# Patient Record
Sex: Female | Born: 2000
Health system: Southern US, Community
[De-identification: ages and names within clinical notes are randomized; demographics above are authoritative.]

## PROBLEM LIST (undated history)

## (undated) DIAGNOSIS — J302 Other seasonal allergic rhinitis: Secondary | ICD-10-CM

## (undated) DIAGNOSIS — J45909 Unspecified asthma, uncomplicated: Secondary | ICD-10-CM

## (undated) HISTORY — DX: Unspecified asthma, uncomplicated: J45.909

## (undated) HISTORY — PX: TYMPANOSTOMY TUBE PLACEMENT: SHX32

---
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
## 2022-03-09 ENCOUNTER — Other Ambulatory Visit (HOSPITAL_COMMUNITY): Payer: Self-pay

## 2022-06-25 ENCOUNTER — Ambulatory Visit: Payer: Self-pay | Admitting: Radiology

## 2022-06-29 ENCOUNTER — Encounter: Payer: Self-pay | Admitting: Radiology

## 2022-06-29 ENCOUNTER — Ambulatory Visit (INDEPENDENT_AMBULATORY_CARE_PROVIDER_SITE_OTHER): Payer: 59 | Admitting: Radiology

## 2022-06-29 VITALS — BP 102/68 | Ht 64.5 in | Wt 125.0 lb

## 2022-06-29 DIAGNOSIS — Z3009 Encounter for other general counseling and advice on contraception: Secondary | ICD-10-CM

## 2022-06-29 DIAGNOSIS — Z01419 Encounter for gynecological examination (general) (routine) without abnormal findings: Secondary | ICD-10-CM | POA: Diagnosis not present

## 2022-06-29 NOTE — Progress Notes (Signed)
   Kayla Rice 16-Nov-2000 [id number]   History:  22 y.o. G 0 presents for annual exam. No gyn concerns. No new partners. Stopped OCPs 3 months ago as cycles were better and her sex drive was lower on OCPs, unsure if she wants to restart hormones, using condoms.  Gynecologic History Patient's last menstrual period was 06/06/2022 (exact date). Period Pattern: (!) Irregular (irregular prior to taking ocps) Menstrual Flow: Moderate, Heavy Menstrual Control: Tampon, Maxi pad Dysmenorrhea: (!) Moderate Dysmenorrhea Symptoms: Cramping Contraception/Family planning: condoms Sexually active: yes, 1 female partner, using condoms, monogramous  Health Maintenance Last Pap: n/a. Results were:  Last mammogram: n/a. Results were:  Last colonoscopy: n/a. Results were:  Last Dexa: n/a. Results were:   Past medical history, past surgical history, family history and social history were all reviewed and documented in the EPIC chart.  ROS:  A ROS was performed and pertinent positives and negatives are included.  Exam:  Vitals:   06/29/22 0948  BP: 102/68  Weight: 125 lb (56.7 kg)  Height: 5' 4.5" (1.638 m)   Body mass index is 21.12 kg/m.  General appearance:  Normal Thyroid:  Symmetrical, normal in size, without palpable masses or nodularity. Respiratory  Auscultation:  Clear without wheezing or rhonchi Cardiovascular  Auscultation:  Regular rate, without rubs, murmurs or gallops  Edema/varicosities:  Not grossly evident Abdominal  Soft,nontender, without masses, guarding or rebound.  Liver/spleen:  No organomegaly noted  Hernia:  None appreciated  Skin  Inspection:  Grossly normal Breasts: Examined lying and sitting.   Right: Without masses, retractions, nipple discharge or axillary adenopathy.   Left: Without masses, retractions, nipple discharge or axillary adenopathy. 1.5cm nevi upper outer quadrant (follow by derm) Genitourinary   Deferred, declines STI screen  Patient  informed chaperone available to be present for breast and pelvic exam. Patient has requested no chaperone to be present. Patient has been advised what will be completed during breast and pelvic exam.   Assessment/Plan:   1. Well woman exam with routine gynecological exam Pap at 23 Declines STI screening  2. General counseling and advice for contraceptive management Discussed other options including nuva ring (may help with low sex drive as it does not effect SHBG) IUD or Nexplanon, Will consider and let us know if she would like to start something.    Discussed SBE regularly, pap screening as directed/appropriate. Recommend 130mins of exercise weekly, including weight bearing exercise. Encouraged the use of seatbelts, condoms and sunscreen. Return in 1 year for annual or as needed.   Kerry Dory WHNP-BC 10:23 AM 06/29/2022

## 2022-08-18 ENCOUNTER — Ambulatory Visit: Payer: 59 | Admitting: Dermatology

## 2022-08-18 VITALS — BP 133/77

## 2022-08-18 DIAGNOSIS — Q825 Congenital non-neoplastic nevus: Secondary | ICD-10-CM

## 2022-08-18 DIAGNOSIS — Z1283 Encounter for screening for malignant neoplasm of skin: Secondary | ICD-10-CM | POA: Diagnosis not present

## 2022-08-18 DIAGNOSIS — L578 Other skin changes due to chronic exposure to nonionizing radiation: Secondary | ICD-10-CM

## 2022-08-18 DIAGNOSIS — D229 Melanocytic nevi, unspecified: Secondary | ICD-10-CM

## 2022-08-18 NOTE — Patient Instructions (Addendum)
     Melanoma ABCDEs  Melanoma is the most dangerous type of skin cancer, and is the leading cause of death from skin disease.  You are more likely to develop melanoma if you: Have light-colored skin, light-colored eyes, or red or blond hair Spend a lot of time in the sun Tan regularly, either outdoors or in a tanning bed Have had blistering sunburns, especially during childhood Have a close family member who has had a melanoma Have atypical moles or large birthmarks  Early detection of melanoma is key since treatment is typically straightforward and cure rates are extremely high if we catch it early.   The first sign of melanoma is often a change in a mole or a new dark spot.  The ABCDE system is a way of remembering the signs of melanoma.  A for asymmetry:  The two halves do not match. B for border:  The edges of the growth are irregular. C for color:  A mixture of colors are present instead of an even brown color. D for diameter:  Melanomas are usually (but not always) greater than 6mm - the size of a pencil eraser. E for evolution:  The spot keeps changing in size, shape, and color.  Please check your skin once per month between visits. You can use a small mirror in front and a large mirror behind you to keep an eye on the back side or your body.   If you see any new or changing lesions before your next follow-up, please call to schedule a visit.  Please continue daily skin protection including broad spectrum sunscreen SPF 30+ to sun-exposed areas, reapplying every 2 hours as needed when you're outdoors.   Staying in the shade or wearing long sleeves, sun glasses (UVA+UVB protection) and wide brim hats (4-inch brim around the entire circumference of the hat) are also recommended for sun protection.    Due to recent changes in healthcare laws, you may see results of your pathology and/or laboratory studies on MyChart before the doctors have had a chance to review them. We  understand that in some cases there may be results that are confusing or concerning to you. Please understand that not all results are received at the same time and often the doctors may need to interpret multiple results in order to provide you with the best plan of care or course of treatment. Therefore, we ask that you please give us 2 business days to thoroughly review all your results before contacting the office for clarification. Should we see a critical lab result, you will be contacted sooner.   If You Need Anything After Your Visit  If you have any questions or concerns for your doctor, please call our main line at [phone] and press option 4 to reach your doctor's medical assistant. If no one answers, please leave a voicemail as directed and we will return your call as soon as possible. Messages left after 4 pm will be answered the following business day.   You may also send us a message via MyChart. We typically respond to MyChart messages within 1-2 business days.  For prescription refills, please ask your pharmacy to contact our office. Our fax number is [phone].  If you have an urgent issue when the clinic is closed that cannot wait until the next business day, you can page your doctor at the number below.    Please note that while we do our best to be available for urgent issues   outside of office hours, we are not available 24/7.   If you have an urgent issue and are unable to reach us, you may choose to seek medical care at your doctor's office, retail clinic, urgent care center, or emergency room.  If you have a medical emergency, please immediately call 911 or go to the emergency department.  Pager Numbers  - Dr. Kowalski: [phone]  - Dr. Moye: [phone]  - Dr. Stewart: [phone]  In the event of inclement weather, please call our main line at [phone] for an update on the status of any delays or closures.  Dermatology Medication Tips: Please  keep the boxes that topical medications come in in order to help keep track of the instructions about where and how to use these. Pharmacies typically print the medication instructions only on the boxes and not directly on the medication tubes.   If your medication is too expensive, please contact our office at [phone] option 4 or send us a message through MyChart.   We are unable to tell what your co-pay for medications will be in advance as this is different depending on your insurance coverage. However, we may be able to find a substitute medication at lower cost or fill out paperwork to get insurance to cover a needed medication.   If a prior authorization is required to get your medication covered by your insurance company, please allow us 1-2 business days to complete this process.  Drug prices often vary depending on where the prescription is filled and some pharmacies may offer cheaper prices.  The website www.goodrx.com contains coupons for medications through different pharmacies. The prices here do not account for what the cost may be with help from insurance (it may be cheaper with your insurance), but the website can give you the price if you did not use any insurance.  - You can print the associated coupon and take it with your prescription to the pharmacy.  - You may also stop by our office during regular business hours and pick up a GoodRx coupon card.  - If you need your prescription sent electronically to a different pharmacy, notify our office through [facility] MyChart or by phone at [phone] option 4.     Si Usted Necesita Algo Despus de Su Visita  Tambin puede enviarnos un mensaje a travs de MyChart. Por lo general respondemos a los mensajes de MyChart en el transcurso de 1 a 2 das hbiles.  Para renovar recetas, por favor pida a su farmacia que se ponga en contacto con nuestra oficina. Nuestro nmero de fax es el [phone].  Si tiene un asunto urgente  cuando la clnica est cerrada y que no puede esperar hasta el siguiente da hbil, puede llamar/localizar a su doctor(a) al nmero que aparece a continuacin.   Por favor, tenga en cuenta que aunque hacemos todo lo posible para estar disponibles para asuntos urgentes fuera del horario de oficina, no estamos disponibles las 24 horas del da, los 7 das de la semana.   Si tiene un problema urgente y no puede comunicarse con nosotros, puede optar por buscar atencin mdica  en el consultorio de su doctor(a), en una clnica privada, en un centro de atencin urgente o en una sala de emergencias.  Si tiene una emergencia mdica, por favor llame inmediatamente al 911 o vaya a la sala de emergencias.  Nmeros de bper  - Dr. Kowalski: [phone]  - Dra. Moye: [phone]  - Dra. Stewart: [phone]  En caso   de inclemencias del tiempo, por favor llame a nuestra lnea principal al [phone] para una actualizacin sobre el estado de cualquier retraso o cierre.  Consejos para la medicacin en dermatologa: Por favor, guarde las cajas en las que vienen los medicamentos de uso tpico para ayudarle a seguir las instrucciones sobre dnde y cmo usarlos. Las farmacias generalmente imprimen las instrucciones del medicamento slo en las cajas y no directamente en los tubos del medicamento.   Si su medicamento es muy caro, por favor, pngase en contacto con nuestra oficina llamando al [phone] y presione la opcin 4 o envenos un mensaje a travs de MyChart.   No podemos decirle cul ser su copago por los medicamentos por adelantado ya que esto es diferente dependiendo de la cobertura de su seguro. Sin embargo, es posible que podamos encontrar un medicamento sustituto a menor costo o llenar un formulario para que el seguro cubra el medicamento que se considera necesario.   Si se requiere una autorizacin previa para que su compaa de seguros cubra su medicamento, por favor permtanos de 1 a 2  das hbiles para completar este proceso.  Los precios de los medicamentos varan con frecuencia dependiendo del lugar de dnde se surte la receta y alguna farmacias pueden ofrecer precios ms baratos.  El sitio web www.goodrx.com tiene cupones para medicamentos de diferentes farmacias. Los precios aqu no tienen en cuenta lo que podra costar con la ayuda del seguro (puede ser ms barato con su seguro), pero el sitio web puede darle el precio si no utiliz ningn seguro.  - Puede imprimir el cupn correspondiente y llevarlo con su receta a la farmacia.  - Tambin puede pasar por nuestra oficina durante el horario de atencin regular y recoger una tarjeta de cupones de GoodRx.  - Si necesita que su receta se enve electrnicamente a una farmacia diferente, informe a nuestra oficina a travs de MyChart de [facility] o por telfono llamando al [phone] y presione la opcin 4.  

## 2022-08-18 NOTE — Progress Notes (Unsigned)
   Follow-Up Visit   Subjective  Kayla Rice is a 22 y.o. female who presents for the following: Skin Cancer Screening and Full Body Skin Exam, no hx of skin cancer, no fhx of skin cancer, concerned about few moles on back and L breast The patient presents for Total-Body Skin Exam (TBSE) for skin cancer screening and mole check. The patient has spots, moles and lesions to be evaluated, some may be new or changing and the patient has concerns that these could be cancer.  The following portions of the chart were reviewed this encounter and updated as appropriate: medications, allergies, medical history  Review of Systems:  No other skin or systemic complaints except as noted in HPI or Assessment and Plan.  Objective  Well appearing patient in no apparent distress; mood and affect are within normal limits.  A full examination was performed including scalp, head, eyes, ears, nose, lips, neck, chest, axillae, abdomen, back, buttocks, bilateral upper extremities, bilateral lower extremities, hands, feet, fingers, toes, fingernails, and toenails. All findings within normal limits unless otherwise noted below.   Relevant physical exam findings are noted in the Assessment and Plan.   Assessment & Plan   MELANOCYTIC NEVI - Tan-brown and/or pink-flesh-colored symmetric macules and papules - Benign appearing on exam today - Observation - Call clinic for new or changing moles - Recommend daily use of broad spectrum spf 30+ sunscreen to sun-exposed areas.   ACTINIC DAMAGE - Chronic condition, secondary to cumulative UV/sun exposure - diffuse scaly erythematous macules with underlying dyspigmentation - Recommend daily broad spectrum sunscreen SPF 30+ to sun-exposed areas, reapply every 2 hours as needed.  - Staying in the shade or wearing long sleeves, sun glasses (UVA+UVB protection) and wide brim hats (4-inch brim around the entire circumference of the hat) are also recommended for sun  protection.  - Call for new or changing lesions.  SKIN CANCER SCREENING PERFORMED TODAY.  CONGENITAL NEVUS Exam: 1.2cm flat reg brown pap, L upper outer quadrant breast Treatment Plan: Benign-appearing.  Observation.  Call clinic for new or changing lesions.  Recommend daily use of broad spectrum spf 30+ sunscreen to sun-exposed areas.      Return if symptoms worsen or fail to improve.  I, Othelia Pulling, RMA, am acting as scribe for Sarina Ser, MD .  Documentation: I have reviewed the above documentation for accuracy and completeness, and I agree with the above.  Sarina Ser, MD

## 2022-08-19 ENCOUNTER — Encounter: Payer: Self-pay | Admitting: Dermatology

## 2022-09-02 ENCOUNTER — Ambulatory Visit: Payer: 59 | Admitting: Family Medicine

## 2022-09-08 ENCOUNTER — Other Ambulatory Visit: Payer: Self-pay

## 2022-09-08 ENCOUNTER — Encounter: Payer: Self-pay | Admitting: Dermatology

## 2022-09-08 ENCOUNTER — Other Ambulatory Visit (HOSPITAL_COMMUNITY): Payer: Self-pay

## 2022-09-08 ENCOUNTER — Ambulatory Visit: Payer: 59 | Admitting: Dermatology

## 2022-09-08 VITALS — BP 114/74 | HR 79

## 2022-09-08 DIAGNOSIS — Q825 Congenital non-neoplastic nevus: Secondary | ICD-10-CM

## 2022-09-08 DIAGNOSIS — L700 Acne vulgaris: Secondary | ICD-10-CM

## 2022-09-08 MED ORDER — SPIRONOLACTONE 50 MG PO TABS
50.0000 mg | ORAL_TABLET | Freq: Two times a day (BID) | ORAL | 2 refills | Status: DC
  Filled 2022-09-08: qty 60, 30d supply, fill #0

## 2022-09-08 MED ORDER — ADAPALENE 0.3 % EX GEL
Freq: Every day | CUTANEOUS | 3 refills | Status: DC
  Filled 2022-09-08: qty 45, 30d supply, fill #0
  Filled 2023-04-21 – 2023-08-28 (×2): qty 45, 30d supply, fill #1

## 2022-09-08 MED ORDER — CLINDAMYCIN PHOS-BENZOYL PEROX 1.2-5 % EX GEL
Freq: Every morning | CUTANEOUS | 3 refills | Status: DC
  Filled 2022-09-08: qty 45, 30d supply, fill #0
  Filled 2023-04-21: qty 45, 30d supply, fill #1

## 2022-09-08 NOTE — Patient Instructions (Addendum)
Discussed resulting small scar with shave removal, and possible recurrence of lesion.  Recommend vaseline ointment to area daily and cover until healed.  Recommend photoprotection/sunscreen to area to prevent discoloration of scar.  Once healed, may apply OTC Serica scar gel bid to thickened scars.   Start Spironolactone 50 MG - take 1 tablet by mouth daily. If no side effects after 2 weeks, may increase to 1 tablet twice daily.  Spironolactone can cause increased urination and cause blood pressure to decrease. Please watch for signs of lightheadedness and be cautious when changing position. It can sometimes cause breast tenderness or an irregular period in premenopausal women. It can also increase potassium. The increase in potassium usually is not a concern unless you are taking other medicines that also increase potassium, so please be sure your doctor knows all of the other medications you are taking. This medication should not be taken by pregnant women.  This medicine should also not be taken together with sulfa drugs like Bactrim (trimethoprim/sulfamethexazole).   Start Duac (clindaymcin-benzoyl peroxide) Gel - apply to face every morning. Risk bleaching. Benzoyl peroxide can cause dryness and irritation of the skin. It can also bleach fabric. When used together with Aczone (dapsone) cream, it can stain the skin orange.  Start Adapalene 0.3% gel Apply a pea-sized amount to face every night as tolerated. May apply moisturizer first.  Topical retinoid medications like tretinoin/Retin-A, adapalene/Differin, tazarotene/Fabior, and Epiduo/Epiduo Forte can cause dryness and irritation when first started. Only apply a pea-sized amount to the entire affected area. Avoid applying it around the eyes, edges of mouth and creases at the nose. If you experience irritation, use a good moisturizer first and/or apply the medicine less often. If you are doing well with the medicine, you can increase how often you use  it until you are applying every night. Be careful with sun protection while using this medication as it can make you sensitive to the sun. This medicine should not be used by pregnant women.

## 2022-09-08 NOTE — Progress Notes (Signed)
Follow-Up Visit   Subjective  Kayla Rice is a 22 y.o. female who presents for the following: Acne Vulgaris of the face. She has used adapalene gel in the past and sulfa wash. She is not currently treating acne. She uses CeraVe Cleanser. She was improved while on birth control pills in the past. She tends to get cystic acne.  She has sensitive skin.  She also has a mole on her left breast that she would like discuss having removed due to irritation from bra. Mole present for years.    The following portions of the chart were reviewed this encounter and updated as appropriate: medications, allergies, medical history  Review of Systems:  No other skin or systemic complaints except as noted in HPI or Assessment and Plan.  Objective  Well appearing patient in no apparent distress; mood and affect are within normal limits.  Areas Examined: Face, chest and back  Relevant exam findings are noted in the Assessment and Plan. Left Lateral Breast 1.2 cm brown plaque, regular pigment       Assessment & Plan   Congenital non-neoplastic nevus Left Lateral Breast  Benign-appearing.  Recommend observation due to size and location, but due to irritation by bra, patient would like removed. Discussed surgical excision vs shave removal. Discussed resulting round discolored scar with shave removal, and possible recurrence of lesion.  Recommend vaseline ointment to area daily and cover until healed.  Recommend photoprotection/sunscreen to area to prevent discoloration of scar.  Once healed, may apply OTC Serica scar gel bid to thickened scars.  Discussed larger linear scar with excision- may stretch and widen, complete removal of mole  Pt wants to schedule for shave removal   ACNE VULGARIS Exam: Closed comedones on the forehead; violaceous macules on the right cheek and jaw; cystic papule on right chin, left chin.  Chronic and persistent condition with duration or expected duration over  one year. Condition is bothersome/symptomatic for patient. Currently flared.   Treatment Plan: BP 114/74 Start Spironolactone 50 MG take 1 tablet by mouth once daily. If no side effects after 2 weeks, may increase to 2 pills daily. Rx written 1 po BID dsp #60 2Rf. Spironolactone can cause increased urination and cause blood pressure to decrease. Please watch for signs of lightheadedness and be cautious when changing position. It can sometimes cause breast tenderness or an irregular period in premenopausal women. It can also increase potassium. The increase in potassium usually is not a concern unless you are taking other medicines that also increase potassium, so please be sure your doctor knows all of the other medications you are taking. This medication should not be taken by pregnant women.  This medicine should also not be taken together with sulfa drugs like Bactrim (trimethoprim/sulfamethexazole).   Start Duac Gel Apply to face QAM for acne  Benzoyl peroxide can cause dryness and irritation of the skin. It can also bleach fabric. When used together with Aczone (dapsone) cream, it can stain the skin orange.  Start Adapalene 0.3% Gel Apply a pea-sized amount to face QHS as tolerated dsp 45g  Topical retinoid medications like tretinoin/Retin-A, adapalene/Differin, tazarotene/Fabior, and Epiduo/Epiduo Forte can cause dryness and irritation when first started. Only apply a pea-sized amount to the entire affected area. Avoid applying it around the eyes, edges of mouth and creases at the nose. If you experience irritation, use a good moisturizer first and/or apply the medicine less often. If you are doing well with the medicine, you can increase how  often you use it until you are applying every night. Be careful with sun protection while using this medication as it can make you sensitive to the sun. This medicine should not be used by pregnant women.     Return in about 10 weeks (around 11/17/2022) for  Acne. Sooner for shave removal congenital nevus L breast.  I, Cherlyn Labella, CMA, am acting as scribe for Willeen Niece, MD .   Documentation: I have reviewed the above documentation for accuracy and completeness, and I agree with the above.  Willeen Niece, MD

## 2022-09-22 ENCOUNTER — Ambulatory Visit (INDEPENDENT_AMBULATORY_CARE_PROVIDER_SITE_OTHER): Payer: 59 | Admitting: Family Medicine

## 2022-09-22 ENCOUNTER — Encounter: Payer: Self-pay | Admitting: Family Medicine

## 2022-09-22 VITALS — BP 90/60 | HR 96 | Temp 99.1°F | Ht 64.0 in | Wt 126.0 lb

## 2022-09-22 DIAGNOSIS — R739 Hyperglycemia, unspecified: Secondary | ICD-10-CM | POA: Diagnosis not present

## 2022-09-22 DIAGNOSIS — Z0001 Encounter for general adult medical examination with abnormal findings: Secondary | ICD-10-CM | POA: Diagnosis not present

## 2022-09-22 DIAGNOSIS — Z114 Encounter for screening for human immunodeficiency virus [HIV]: Secondary | ICD-10-CM | POA: Diagnosis not present

## 2022-09-22 DIAGNOSIS — R7301 Impaired fasting glucose: Secondary | ICD-10-CM | POA: Diagnosis not present

## 2022-09-22 DIAGNOSIS — Z0000 Encounter for general adult medical examination without abnormal findings: Secondary | ICD-10-CM | POA: Diagnosis not present

## 2022-09-22 DIAGNOSIS — Z1159 Encounter for screening for other viral diseases: Secondary | ICD-10-CM | POA: Diagnosis not present

## 2022-09-22 LAB — CBC WITH DIFFERENTIAL/PLATELET
Absolute Monocytes: 636 cells/uL (ref 200–950)
MPV: 10.8 fL (ref 7.5–12.5)
Monocytes Relative: 12 %
Neutrophils Relative %: 62.3 %
RDW: 12 % (ref 11.0–15.0)
WBC: 5.3 10*3/uL (ref 3.8–10.8)

## 2022-09-22 NOTE — Addendum Note (Signed)
Addended by: Park Meo on: 09/22/2022 02:11 PM   Modules accepted: Orders

## 2022-09-22 NOTE — Patient Instructions (Signed)
It was great to meet you today and I'm excited to have you join the [facility] practice. I hope you had a positive experience today! If you feel so inclined, please feel free to recommend our practice to friends and family. Alynna Hargrove, FNP-C  

## 2022-09-22 NOTE — Progress Notes (Signed)
New Patient Office Visit  Subjective    Patient ID: Kayla Rice, female    DOB: 2000-10-29  Age: 22 y.o. MRN: [id number]  CC:  Chief Complaint  Patient presents with   Establish Care    HPI Arie Spanier presents to establish care. Oriented to practice routines and expectations. PMH includes acne managed by dermatology and well controlled on current regimen. 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      Outpatient Encounter Medications as of 09/22/2022  Medication Sig   Adapalene (DIFFERIN) 0.3 % gel Apply a pea sized amount to face every night as tolerated   cetirizine (ZYRTEC) 10 MG tablet Take 1 tablet (10 mg total) by mouth daily.   Clindamycin-Benzoyl Per, Refr, gel Apply to face every morning for acne.   spironolactone (ALDACTONE) 50 MG tablet Take 1 tablet (50 mg total) by mouth 2 (two) times daily.   olopatadine (PATANOL) 0.1 % ophthalmic solution Place 1 drop into both eyes 2 (two) times daily as needed for allergies. (Patient not taking: Reported on 09/22/2022)   No facility-administered encounter medications on file as of 09/22/2022.    Past Medical History:  Diagnosis Date   Asthma    as a child when sick   Seasonal allergies     Past Surgical History:  Procedure Laterality Date   TYMPANOSTOMY TUBE PLACEMENT      Family History  Problem Relation Age of Onset   Diabetes Maternal Grandmother    Hypertension Maternal Grandmother    Thyroid disease Maternal Grandmother    Heart attack Maternal Grandmother    Prostate cancer Maternal Grandfather    Breast cancer Other        great paternal grandmother   Healthy Mother    Healthy Father     Social History   Socioeconomic History   Marital status: Single    Spouse name: Not on file   Number of children: Not on file   Years of education: Not on file   Highest education level: Not on file  Occupational History   Not on file  Tobacco Use   Smoking status: Never    Passive exposure:  Never   Smokeless tobacco: Never  Vaping Use   Vaping Use: Never used  Substance and Sexual Activity   Alcohol use: Never   Drug use: Never   Sexual activity: Yes    Partners: Male    Birth control/protection: Condom    Comment: menarche 22yo, sexual debut 22yo  Other Topics Concern   Not on file  Social History Narrative   Not on file   Social Determinants of Health   Financial Resource Strain: Not on file  Food Insecurity: Not on file  Transportation Needs: Not on file  Physical Activity: Not on file  Stress: Not on file  Social Connections: Not on file  Intimate Partner Violence: Not on file    Review of Systems  Constitutional: Negative.   HENT: Negative.    Eyes: Negative.   Respiratory: Negative.    Cardiovascular: Negative.   Gastrointestinal: Negative.   Genitourinary: Negative.   Musculoskeletal: Negative.   Skin: Negative.   Neurological: Negative.   Endo/Heme/Allergies: Negative.   Psychiatric/Behavioral: Negative.    All other systems reviewed and are negative.       Objective    BP 90/60 Comment: pt was a little the weather the last few days  Pulse 96   Temp 99.1 F (37.3 C) (Oral)   Ht 5\' 4"  (  1.626 m)   Wt 126 lb (57.2 kg)   LMP 08/12/2021 Comment: just come off birth control  SpO2 100%   BMI 21.63 kg/m   Physical Exam Vitals and nursing note reviewed.  Constitutional:      Appearance: Normal appearance. She is normal weight.  HENT:     Head: Normocephalic and atraumatic.     Right Ear: Tympanic membrane, ear canal and external ear normal.     Left Ear: Tympanic membrane, ear canal and external ear normal.     Nose: Nose normal.     Mouth/Throat:     Mouth: Mucous membranes are moist.     Pharynx: Oropharynx is clear.  Eyes:     Extraocular Movements: Extraocular movements intact.     Conjunctiva/sclera: Conjunctivae normal.     Pupils: Pupils are equal, round, and reactive to light.  Cardiovascular:     Rate and Rhythm: Normal  rate and regular rhythm.     Pulses: Normal pulses.     Heart sounds: Normal heart sounds.  Pulmonary:     Effort: Pulmonary effort is normal.     Breath sounds: Normal breath sounds.  Abdominal:     General: Bowel sounds are normal.     Palpations: Abdomen is soft.  Musculoskeletal:        General: Normal range of motion.     Cervical back: Normal range of motion and neck supple.  Skin:    General: Skin is warm and dry.     Capillary Refill: Capillary refill takes less than 2 seconds.  Neurological:     General: No focal deficit present.     Mental Status: She is alert and oriented to person, place, and time. Mental status is at baseline.  Psychiatric:        Mood and Affect: Mood normal.        Behavior: Behavior normal.        Thought Content: Thought content normal.        Judgment: Judgment normal.         Assessment & Plan:   Problem List Items Addressed This Visit     Physical exam, annual - Primary    Today your medical history was reviewed and routine physical exam with labs was performed. In addition we completed required labs and form for [facility].  Recommend 150 minutes of moderate intensity exercise weekly and consuming a well-balanced diet. Advised to stop smoking if a smoker, avoid smoking if a non-smoker, limit alcohol consumption to 1 drink per day for women and 2 drinks per day for men, and avoid illicit drug use. Counseled on safe sex practices and offered STI testing today. Counseled on the importance of sunscreen use. Counseled in mental health awareness and when to seek medical care. Vaccine maintenance discussed. Appropriate health maintenance items reviewed. Return to office in 1 year for annual physical exam.       Relevant Orders   CBC with Differential/Platelet   COMPLETE METABOLIC PANEL WITH GFR   QuantiFERON-TB Gold Plus   LDL Cholesterol, Direct   HBsAb Quant HBIG Assessment   Varicella zoster antibody, IgG   Measles/Mumps/Rubella Immunity    Other Visit Diagnoses     Screening for HIV (human immunodeficiency virus)       Relevant Orders   HIV Antibody (routine testing w rflx)   Need for hepatitis C screening test       Relevant Orders   Hepatitis C antibody  Return in about 1 year (around 09/22/2023) for annual physical.   Park Meo, FNP

## 2022-09-22 NOTE — Assessment & Plan Note (Signed)
Today your medical history was reviewed and routine physical exam with labs was performed. In addition we completed required labs and form for [facility].  Recommend 150 minutes of moderate intensity exercise weekly and consuming a well-balanced diet. Advised to stop smoking if a smoker, avoid smoking if a non-smoker, limit alcohol consumption to 1 drink per day for women and 2 drinks per day for men, and avoid illicit drug use. Counseled on safe sex practices and offered STI testing today. Counseled on the importance of sunscreen use. Counseled in mental health awareness and when to seek medical care. Vaccine maintenance discussed. Appropriate health maintenance items reviewed. Return to office in 1 year for annual physical exam.

## 2022-09-23 LAB — COMPLETE METABOLIC PANEL WITH GFR
ALT: 14 U/L (ref 5–32)
AST: 16 U/L (ref 12–32)
Chloride: 105 mmol/L (ref 98–110)
Globulin: 2.9 g/dL (calc) (ref 2.0–3.8)
Total Bilirubin: 0.4 mg/dL (ref 0.2–1.1)
Total Protein: 7.2 g/dL (ref 6.3–8.2)

## 2022-09-23 LAB — MEASLES/MUMPS/RUBELLA IMMUNITY
Mumps IgG: 189 AU/mL
Rubella: 1.06 Index
Rubeola IgG: >300 AU/mL

## 2022-09-23 LAB — CBC WITH DIFFERENTIAL/PLATELET
MCHC: 34.3 g/dL (ref 32.0–36.0)
MCV: 87.5 fL (ref 80.0–100.0)
Platelets: 259 10*3/uL (ref 140–400)
RBC: 4.63 10*6/uL (ref 3.80–5.10)
Total Lymphocyte: 18.9 %

## 2022-09-23 LAB — HEPATITIS B SURFACE ANTIBODY, QUANTITATIVE: Hep B S AB Quant (Post): <5 m[IU]/mL — ABNORMAL LOW (ref >=10)

## 2022-09-23 LAB — VARICELLA ZOSTER ANTIBODY, IGG: Varicella IgG: 283.6 index

## 2022-09-23 NOTE — Addendum Note (Signed)
Addended by: Park Meo on: 09/23/2022 07:46 AM   Modules accepted: Orders

## 2022-09-24 LAB — QUANTIFERON-TB GOLD PLUS
Mitogen-NIL: >10 IU/mL
NIL: 0.08 IU/mL
QuantiFERON-TB Gold Plus: NEGATIVE
TB1-NIL: 0.01 IU/mL
TB2-NIL: <0 IU/mL

## 2022-09-24 LAB — CBC WITH DIFFERENTIAL/PLATELET
Basophils Absolute: 21 cells/uL (ref 0–200)
Basophils Relative: 0.4 %
Eosinophils Absolute: 339 cells/uL (ref 15–500)
Eosinophils Relative: 6.4 %
HCT: 40.5 % (ref 35.0–45.0)
Hemoglobin: 13.9 g/dL (ref 11.7–15.5)
Lymphs Abs: 1002 cells/uL (ref 850–3900)
MCH: 30 pg (ref 27.0–33.0)
Neutro Abs: 3302 cells/uL (ref 1500–7800)

## 2022-09-24 LAB — COMPLETE METABOLIC PANEL WITH GFR
AG Ratio: 1.5 (calc) (ref 1.0–2.5)
Albumin: 4.3 g/dL (ref 3.6–5.1)
Alkaline phosphatase (APISO): 54 U/L (ref 36–128)
BUN: 10 mg/dL (ref 7–20)
CO2: 23 mmol/L (ref 20–32)
Calcium: 9.4 mg/dL (ref 8.9–10.4)
Creat: 0.84 mg/dL (ref 0.50–0.96)
Glucose, Bld: 102 mg/dL — ABNORMAL HIGH (ref 65–99)
Potassium: 4.1 mmol/L (ref 3.8–5.1)
Sodium: 139 mmol/L (ref 135–146)
eGFR: 103 mL/min/{1.73_m2} (ref >=60)

## 2022-09-24 LAB — HIV ANTIBODY (ROUTINE TESTING W REFLEX): HIV 1&2 Ab, 4th Generation: NONREACTIVE

## 2022-09-24 LAB — HEMOGLOBIN A1C
Hgb A1c MFr Bld: 4.9 % of total Hgb (ref <5.7)
Mean Plasma Glucose: 94 mg/dL
eAG (mmol/L): 5.2 mmol/L

## 2022-09-24 LAB — LDL CHOLESTEROL, DIRECT: Direct LDL: 74 mg/dL (ref <110)

## 2022-09-24 LAB — HEPATITIS C ANTIBODY: Hepatitis C Ab: NONREACTIVE

## 2022-09-29 ENCOUNTER — Ambulatory Visit: Payer: 59 | Admitting: Dermatology

## 2022-09-29 VITALS — BP 114/80 | HR 88

## 2022-09-29 DIAGNOSIS — D225 Melanocytic nevi of trunk: Secondary | ICD-10-CM | POA: Diagnosis not present

## 2022-09-29 DIAGNOSIS — D485 Neoplasm of uncertain behavior of skin: Secondary | ICD-10-CM

## 2022-09-29 NOTE — Progress Notes (Signed)
   Follow-Up Visit   Subjective  Kayla Rice is a 22 y.o. 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 Patient here for shave removal due to irritation from bra.  Patient accompanied by mother.  The following portions of the chart were reviewed this encounter and updated as appropriate: medications, allergies, medical history  Review of Systems:  No other skin or systemic complaints except as noted in HPI or Assessment and Plan.  Objective  Well appearing patient in no apparent distress; mood and affect are within normal limits.  A focused examination was performed of the following areas: Face, left breast Relevant physical exam findings are noted in the Assessment and Plan.  Left Lateral Breast 1.4 x 1.0 cm brown plaque, regular pigment        Assessment & Plan   Neoplasm of uncertain behavior of skin Left Lateral Breast  Epidermal / dermal shaving  Lesion diameter (cm):  2 Informed consent: discussed and consent obtained   Patient was prepped and draped in usual sterile fashion: Area prepped with alcohol. Anesthesia: the lesion was anesthetized in a standard fashion   Anesthetic:  1% lidocaine w/ epinephrine 1-100,000 buffered w/ 8.4% NaHCO3 Instrument used: flexible razor blade   Hemostasis achieved with: pressure, aluminum chloride and electrodesiccation   Outcome: patient tolerated procedure well   Post-procedure details: wound care instructions given   Post-procedure details comment:  Ointment and small bandage applied Additional details:  Final 2.0 x 1.1 CM  Specimen 1 - Surgical pathology Differential Diagnosis: Congenital Nevus vs other Check Margins: No  Discussed resulting scar with shave removal, and possible recurrence of lesion.  Recommend vaseline ointment to area daily and cover until healed.  Recommend photoprotection/sunscreen to area to prevent discoloration of scar.  Once healed, may apply OTC Serica  scar gel bid to thickened scars.      Return as scheduled for acne f/up.  ICherlyn Labella, CMA, am acting as scribe for Willeen Niece, MD .   Documentation: I have reviewed the above documentation for accuracy and completeness, and I agree with the above.  Willeen Niece, MD

## 2022-09-29 NOTE — Patient Instructions (Signed)
Wound Care Instructions  Cleanse wound gently with soap and water once a day then pat dry with clean gauze. Apply a thin coat of Petrolatum (petroleum jelly, "Vaseline") over the wound (unless you have an allergy to this). We recommend that you use a new, sterile tube of Vaseline. Do not pick or remove scabs. Do not remove the yellow or white "healing tissue" from the base of the wound.  Cover the wound with fresh, clean, nonstick gauze and secure with paper tape. You may use Band-Aids in place of gauze and tape if the wound is small enough, but would recommend trimming much of the tape off as there is often too much. Sometimes Band-Aids can irritate the skin.  You should call the office for your biopsy report after 1 week if you have not already been contacted.  If you experience any problems, such as abnormal amounts of bleeding, swelling, significant bruising, significant pain, or evidence of infection, please call the office immediately.  FOR ADULT SURGERY PATIENTS: If you need something for pain relief you may take 1 extra strength Tylenol (acetaminophen) AND 2 Ibuprofen (200mg each) together every 4 hours as needed for pain. (do not take these if you are allergic to them or if you have a reason you should not take them.) Typically, you may only need pain medication for 1 to 3 days.     Due to recent changes in healthcare laws, you may see results of your pathology and/or laboratory studies on MyChart before the doctors have had a chance to review them. We understand that in some cases there may be results that are confusing or concerning to you. Please understand that not all results are received at the same time and often the doctors may need to interpret multiple results in order to provide you with the best plan of care or course of treatment. Therefore, we ask that you please give us 2 business days to thoroughly review all your results before contacting the office for clarification. Should  we see a critical lab result, you will be contacted sooner.   If You Need Anything After Your Visit  If you have any questions or concerns for your doctor, please call our main line at [phone] and press option 4 to reach your doctor's medical assistant. If no one answers, please leave a voicemail as directed and we will return your call as soon as possible. Messages left after 4 pm will be answered the following business day.   You may also send us a message via MyChart. We typically respond to MyChart messages within 1-2 business days.  For prescription refills, please ask your pharmacy to contact our office. Our fax number is [phone].  If you have an urgent issue when the clinic is closed that cannot wait until the next business day, you can page your doctor at the number below.    Please note that while we do our best to be available for urgent issues outside of office hours, we are not available 24/7.   If you have an urgent issue and are unable to reach us, you may choose to seek medical care at your doctor's office, retail clinic, urgent care center, or emergency room.  If you have a medical emergency, please immediately call 911 or go to the emergency department.  Pager Numbers  - Dr. Kowalski: [phone]  - Dr. Moye: [phone]  - Dr. Stewart: [phone]  In the event of inclement weather, please call our main line at   [phone] for an update on the status of any delays or closures.  Dermatology Medication Tips: Please keep the boxes that topical medications come in in order to help keep track of the instructions about where and how to use these. Pharmacies typically print the medication instructions only on the boxes and not directly on the medication tubes.   If your medication is too expensive, please contact our office at [phone] option 4 or send us a message through MyChart.   We are unable to tell what your co-pay for medications will be in  advance as this is different depending on your insurance coverage. However, we may be able to find a substitute medication at lower cost or fill out paperwork to get insurance to cover a needed medication.   If a prior authorization is required to get your medication covered by your insurance company, please allow us 1-2 business days to complete this process.  Drug prices often vary depending on where the prescription is filled and some pharmacies may offer cheaper prices.  The website www.goodrx.com contains coupons for medications through different pharmacies. The prices here do not account for what the cost may be with help from insurance (it may be cheaper with your insurance), but the website can give you the price if you did not use any insurance.  - You can print the associated coupon and take it with your prescription to the pharmacy.  - You may also stop by our office during regular business hours and pick up a GoodRx coupon card.  - If you need your prescription sent electronically to a different pharmacy, notify our office through [facility] MyChart or by phone at [phone] option 4.     Si Usted Necesita Algo Despus de Su Visita  Tambin puede enviarnos un mensaje a travs de MyChart. Por lo general respondemos a los mensajes de MyChart en el transcurso de 1 a 2 das hbiles.  Para renovar recetas, por favor pida a su farmacia que se ponga en contacto con nuestra oficina. Nuestro nmero de fax es el [phone].  Si tiene un asunto urgente cuando la clnica est cerrada y que no puede esperar hasta el siguiente da hbil, puede llamar/localizar a su doctor(a) al nmero que aparece a continuacin.   Por favor, tenga en cuenta que aunque hacemos todo lo posible para estar disponibles para asuntos urgentes fuera del horario de oficina, no estamos disponibles las 24 horas del da, los 7 das de la semana.   Si tiene un problema urgente y no puede comunicarse con nosotros, puede  optar por buscar atencin mdica  en el consultorio de su doctor(a), en una clnica privada, en un centro de atencin urgente o en una sala de emergencias.  Si tiene una emergencia mdica, por favor llame inmediatamente al 911 o vaya a la sala de emergencias.  Nmeros de bper  - Dr. Kowalski: [phone]  - Dra. Moye: [phone]  - Dra. Stewart: [phone]  En caso de inclemencias del tiempo, por favor llame a nuestra lnea principal al [phone] para una actualizacin sobre el estado de cualquier retraso o cierre.  Consejos para la medicacin en dermatologa: Por favor, guarde las cajas en las que vienen los medicamentos de uso tpico para ayudarle a seguir las instrucciones sobre dnde y cmo usarlos. Las farmacias generalmente imprimen las instrucciones del medicamento slo en las cajas y no directamente en los tubos del medicamento.   Si su medicamento es muy caro, por favor, pngase en contacto con   nuestra oficina llamando al [phone] y presione la opcin 4 o envenos un mensaje a travs de MyChart.   No podemos decirle cul ser su copago por los medicamentos por adelantado ya que esto es diferente dependiendo de la cobertura de su seguro. Sin embargo, es posible que podamos encontrar un medicamento sustituto a menor costo o llenar un formulario para que el seguro cubra el medicamento que se considera necesario.   Si se requiere una autorizacin previa para que su compaa de seguros cubra su medicamento, por favor permtanos de 1 a 2 das hbiles para completar este proceso.  Los precios de los medicamentos varan con frecuencia dependiendo del lugar de dnde se surte la receta y alguna farmacias pueden ofrecer precios ms baratos.  El sitio web www.goodrx.com tiene cupones para medicamentos de diferentes farmacias. Los precios aqu no tienen en cuenta lo que podra costar con la ayuda del seguro (puede ser ms barato con su seguro), pero el sitio web puede darle el  precio si no utiliz ningn seguro.  - Puede imprimir el cupn correspondiente y llevarlo con su receta a la farmacia.  - Tambin puede pasar por nuestra oficina durante el horario de atencin regular y recoger una tarjeta de cupones de GoodRx.  - Si necesita que su receta se enve electrnicamente a una farmacia diferente, informe a nuestra oficina a travs de MyChart de [facility] o por telfono llamando al [phone] y presione la opcin 4.  

## 2022-10-04 ENCOUNTER — Telehealth: Payer: Self-pay

## 2022-10-04 NOTE — Telephone Encounter (Signed)
Pt's mom called regarding pt's Hepatitis B results and if patient will need to repeat the series? Thanks.

## 2022-10-04 NOTE — Telephone Encounter (Signed)
LVM for pt's mom per pt's DPR, regarding revaccinated for pt's Heb B vaccines per Amber,FNP.

## 2022-10-04 NOTE — Telephone Encounter (Signed)
Left message on cell phone, ok per DPR, advising patient biopsy was benign. Patient to call the office for any questions or concerns.

## 2022-10-04 NOTE — Telephone Encounter (Signed)
-----   Message from Willeen Niece, MD sent at 10/04/2022  1:01 PM EDT ----- Skin , left lateral breast MELANOCYTIC NEVUS, COMPOUND TYPE, PERIPHERAL MARGIN INVOLVED  Benign mole, will observe for recurrence - please call patient

## 2022-10-06 ENCOUNTER — Ambulatory Visit (INDEPENDENT_AMBULATORY_CARE_PROVIDER_SITE_OTHER): Payer: 59

## 2022-10-06 DIAGNOSIS — Z0184 Encounter for antibody response examination: Secondary | ICD-10-CM | POA: Diagnosis not present

## 2022-11-03 ENCOUNTER — Ambulatory Visit (INDEPENDENT_AMBULATORY_CARE_PROVIDER_SITE_OTHER): Payer: 59 | Admitting: Family Medicine

## 2022-11-03 ENCOUNTER — Other Ambulatory Visit (HOSPITAL_COMMUNITY): Payer: Self-pay

## 2022-11-03 ENCOUNTER — Encounter: Payer: Self-pay | Admitting: Family Medicine

## 2022-11-03 VITALS — BP 115/62 | HR 87 | Temp 98.4°F | Ht 64.0 in | Wt 128.0 lb

## 2022-11-03 DIAGNOSIS — F418 Other specified anxiety disorders: Secondary | ICD-10-CM

## 2022-11-03 MED ORDER — HYDROXYZINE PAMOATE 25 MG PO CAPS
25.0000 mg | ORAL_CAPSULE | Freq: Three times a day (TID) | ORAL | 0 refills | Status: DC | PRN
  Filled 2022-11-03: qty 30, 10d supply, fill #0

## 2022-11-03 NOTE — Progress Notes (Signed)
Subjective:  HPI: Kayla Rice is a 22 y.o. female presenting on 11-20-22 for Follow-up (anxiety)   HPI Patient is in today for anxiety. She reports feeling anxious in stressful situations more days than not for many months. Symptoms include shakiness and sweating. She would like to try a medication for occasional use. Denies SI/HI.  ANXIETY/STRESS Duration:uncontrolled Anxious mood: yes  Excessive worrying: yes Irritability: yes  Sweating: yes Nausea: no Palpitations:no Hyperventilation: no Panic attacks: no Agoraphobia: no  Obscessions/compulsions: no Depressed mood: no    November 20, 2022    9:16 AM  Depression screen PHQ 2/9  Decreased Interest 0  Down, Depressed, Hopeless 0  PHQ - 2 Score 0  Altered sleeping 0  Tired, decreased energy 0  Change in appetite 0  Feeling bad or failure about yourself  0  Trouble concentrating 0  Moving slowly or fidgety/restless 0  Suicidal thoughts 0  PHQ-9 Score 0  Difficult doing work/chores Not difficult at all   Anhedonia: no Weight changes: no Insomnia: no   Hypersomnia: no Fatigue/loss of energy: no Feelings of worthlessness: no Feelings of guilt: no Impaired concentration/indecisiveness: no Suicidal ideations: no  Crying spells: no Recent Stressors/Life Changes: yes   Relationship problems: no   Family stress: yes     Financial stress: no    Job stress: no    Recent death/loss: no    2022-11-20    9:16 AM  GAD 7 : Generalized Anxiety Score  Nervous, Anxious, on Edge 3  Control/stop worrying 1  Worry too much - different things 2  Trouble relaxing 2  Restless 2  Easily annoyed or irritable 2  Afraid - awful might happen 0  Total GAD 7 Score 12  Anxiety Difficulty Very difficult      Review of Systems  All other systems reviewed and are negative.   Relevant past medical history reviewed and updated as indicated.   Past Medical History:  Diagnosis Date   Asthma    as a child when sick   Seasonal  allergies      Past Surgical History:  Procedure Laterality Date   TYMPANOSTOMY TUBE PLACEMENT      Allergies and medications reviewed and updated.   Current Outpatient Medications:    Adapalene (DIFFERIN) 0.3 % gel, Apply a pea sized amount to face every night as tolerated, Disp: 45 g, Rfl: 3   cetirizine (ZYRTEC) 10 MG tablet, Take 1 tablet (10 mg total) by mouth daily., Disp: 30 tablet, Rfl: 5   Clindamycin-Benzoyl Per, Refr, gel, Apply to face every morning for acne., Disp: 45 g, Rfl: 3   hydrOXYzine (VISTARIL) 50 MG capsule, Take 1 capsule (50 mg total) by mouth 3 (three) times daily as needed., Disp: 30 capsule, Rfl: 0   spironolactone (ALDACTONE) 50 MG tablet, Take 1 tablet (50 mg total) by mouth 2 (two) times daily., Disp: 60 tablet, Rfl: 2   olopatadine (PATANOL) 0.1 % ophthalmic solution, Place 1 drop into both eyes 2 (two) times daily as needed for allergies. (Patient not taking: Reported on 20-Nov-2022), Disp: 5 mL, Rfl: 5  No Known Allergies  Objective:   BP 115/62   Pulse 87   Temp 98.4 F (36.9 C) (Oral)   Ht 5\' 4"  (1.626 m)   Wt 128 lb (58.1 kg)   SpO2 99%   BMI 21.97 kg/m      2022/11/20    9:14 AM 09/29/2022    9:07 AM 09/22/2022   11:34 AM  Vitals with BMI  Height 5\' 4"   5\' 4"   Weight 128 lbs  126 lbs  BMI 21.96  21.62  Systolic 115 114 90  Diastolic 62 80 60  Pulse 87 88 96     Physical Exam Vitals and nursing note reviewed.  Constitutional:      Appearance: Normal appearance. She is normal weight.  HENT:     Head: Normocephalic and atraumatic.  Skin:    General: Skin is warm and dry.  Neurological:     General: No focal deficit present.     Mental Status: She is alert and oriented to person, place, and time. Mental status is at baseline.  Psychiatric:        Mood and Affect: Mood normal.        Speech: Speech normal.        Behavior: Behavior normal.        Thought Content: Thought content normal.        Judgment: Judgment normal.      Assessment & Plan:  Situational anxiety Assessment & Plan: Has not tried therapy in the past, recommended seeking therapist for CBT. Discussed benefits of this with patient. She would also like to try pharmacotherapy PRN, declines daily medication at this time. Will try Vistaril 50mg  up to 4 times daily PRN. Return to office if symptoms persist or worsen.   Other orders -     hydrOXYzine Pamoate; Take 1 capsule (50 mg total) by mouth 3 (three) times daily as needed.  Dispense: 30 capsule; Refill: 0     Follow up plan: Return if symptoms worsen or fail to improve.  Park Meo, FNP

## 2022-11-03 NOTE — Assessment & Plan Note (Signed)
Has not tried therapy in the past, recommended seeking therapist for CBT. Discussed benefits of this with patient. She would also like to try pharmacotherapy PRN, declines daily medication at this time. Will try Vistaril 50mg  up to 4 times daily PRN. Return to office if symptoms persist or worsen.

## 2022-11-08 ENCOUNTER — Encounter: Payer: Self-pay | Admitting: Family Medicine

## 2022-11-08 ENCOUNTER — Telehealth: Payer: Self-pay

## 2022-11-08 NOTE — Telephone Encounter (Signed)
My Chart message from patient:   Good morning NP Julien Girt been taking the hydroxyzine as needed for my anxiety, but I'm not sure it's really working for me. I took one about an hour before an anxious situation and I still felt all of my symptoms.    Thank you for your time!

## 2022-11-16 ENCOUNTER — Other Ambulatory Visit: Payer: Self-pay | Admitting: Family Medicine

## 2022-11-16 ENCOUNTER — Other Ambulatory Visit (HOSPITAL_COMMUNITY): Payer: Self-pay

## 2022-11-16 ENCOUNTER — Encounter: Payer: Self-pay | Admitting: Family Medicine

## 2022-11-16 DIAGNOSIS — F418 Other specified anxiety disorders: Secondary | ICD-10-CM

## 2022-11-16 MED ORDER — HYDROXYZINE PAMOATE 25 MG PO CAPS
50.0000 mg | ORAL_CAPSULE | Freq: Four times a day (QID) | ORAL | 0 refills | Status: DC | PRN
  Filled 2022-11-16: qty 30, 4d supply, fill #0

## 2022-11-17 ENCOUNTER — Other Ambulatory Visit: Payer: 59

## 2022-11-17 DIAGNOSIS — Z0184 Encounter for antibody response examination: Secondary | ICD-10-CM | POA: Diagnosis not present

## 2022-11-18 LAB — HEPATITIS B SURFACE ANTIBODY, QUANTITATIVE: Hep B S AB Quant (Post): 29 m[IU]/mL (ref >=10)

## 2022-11-22 ENCOUNTER — Ambulatory Visit: Payer: 59 | Admitting: Dermatology

## 2022-11-22 VITALS — BP 112/75

## 2022-11-22 DIAGNOSIS — D225 Melanocytic nevi of trunk: Secondary | ICD-10-CM | POA: Diagnosis not present

## 2022-11-22 DIAGNOSIS — D229 Melanocytic nevi, unspecified: Secondary | ICD-10-CM

## 2022-11-22 DIAGNOSIS — L700 Acne vulgaris: Secondary | ICD-10-CM

## 2022-11-22 MED ORDER — WINLEVI 1 % EX CREA: 1.0000 | TOPICAL_CREAM | Freq: Two times a day (BID) | CUTANEOUS | 4 refills | Status: DC

## 2022-11-22 NOTE — Progress Notes (Signed)
   Follow-Up Visit   Subjective  Kayla Rice is a 22 y.o. female who presents for the following: Acne Vulgaris face, stopped Spironolactone 50mg  due to making her dizzy, Duac gel qam, Adapalene 0.3% gel at bedtime, improved but still breaking out,  Bx proven nevus L lat breast   The following portions of the chart were reviewed this encounter and updated as appropriate: medications, allergies, medical history  Review of Systems:  No other skin or systemic complaints except as noted in HPI or Assessment and Plan.  Objective  Well appearing patient in no apparent distress; mood and affect are within normal limits.  Areas Examined: Face, chest and back  Relevant exam findings are noted in the Assessment and Plan.   Assessment & Plan    ACNE VULGARIS Exam: closed comedones forehead, inflammatory papules R medial cheek  Chronic and persistent condition with duration or expected duration over one year. Condition is symptomatic/ bothersome to patient. Not currently at goal.   Treatment Plan: Cont Duac gel qam to spot treat bumps or qam Cont Adapalene 0.3% gel at bedtime Start Winlevi cr bid to face, samples x 2, Lot [id number] exp 08/25 D/c Spironolactone 50mg   Topical retinoid medications like tretinoin/Retin-A, adapalene/Differin, tazarotene/Fabior, and Epiduo/Epiduo Forte can cause dryness and irritation when first started. Only apply a pea-sized amount to the entire affected area. Avoid applying it around the eyes, edges of mouth and creases at the nose. If you experience irritation, use a good moisturizer first and/or apply the medicine less often. If you are doing well with the medicine, you can increase how often you use it until you are applying every night. Be careful with sun protection while using this medication as it can make you sensitive to the sun. This medicine should not be used by pregnant women.    Benzoyl peroxide can cause dryness and irritation of the skin. It can  also bleach fabric. When used together with Aczone (dapsone) cream, it can stain the skin orange.   MELANOCYTIC NEVUS Bx proven benign L lat breast Exam: healed pink bx site, faint hyperpigmentation at medial edge  Treatment Plan: Benign, observe.  Healing well. Discussed Serica scar gel.  Will continue to fade. Discussed targeted shave removal if repigments.   Return for 4-6 months for Acne f/u.  I, Ardis Rowan, RMA, am acting as scribe for Willeen Niece, MD .   Documentation: I have reviewed the above documentation for accuracy and completeness, and I agree with the above.  Willeen Niece, MD

## 2022-11-22 NOTE — Patient Instructions (Signed)
Your prescription was sent to [facility] in [place], [place]. You will receive a text message from [facility]. Clink the link to confirm your information. Enrollment only takes 2 minutes. Confirm your insurance. You will get the lowest possible price based on your coverage. Confirm your payment and delivery information. After your co-pay is verified, you will receive a text with a shipment tracking link. If for any reason you do not receive a text message from them, please reach out to them. Their phone number is [phone].       Due to recent changes in healthcare laws, you may see results of your pathology and/or laboratory studies on MyChart before the doctors have had a chance to review them. We understand that in some cases there may be results that are confusing or concerning to you. Please understand that not all results are received at the same time and often the doctors may need to interpret multiple results in order to provide you with the best plan of care or course of treatment. Therefore, we ask that you please give us 2 business days to thoroughly review all your results before contacting the office for clarification. Should we see a critical lab result, you will be contacted sooner.   If You Need Anything After Your Visit  If you have any questions or concerns for your doctor, please call our main line at [phone] and press option 4 to reach your doctor's medical assistant. If no one answers, please leave a voicemail as directed and we will return your call as soon as possible. Messages left after 4 pm will be answered the following business day.   You may also send us a message via MyChart. We typically respond to MyChart messages within 1-2 business days.  For prescription refills, please ask your pharmacy to contact our office. Our fax number is [phone].  If you have an urgent issue when the clinic is closed that cannot wait until the next business day, you can page your  doctor at the number below.    Please note that while we do our best to be available for urgent issues outside of office hours, we are not available 24/7.   If you have an urgent issue and are unable to reach us, you may choose to seek medical care at your doctor's office, retail clinic, urgent care center, or emergency room.  If you have a medical emergency, please immediately call 911 or go to the emergency department.  Pager Numbers  - Dr. Kowalski: [phone]  - Dr. Moye: [phone]  - Dr. Stewart: [phone]  In the event of inclement weather, please call our main line at [phone] for an update on the status of any delays or closures.  Dermatology Medication Tips: Please keep the boxes that topical medications come in in order to help keep track of the instructions about where and how to use these. Pharmacies typically print the medication instructions only on the boxes and not directly on the medication tubes.   If your medication is too expensive, please contact our office at [phone] option 4 or send us a message through MyChart.   We are unable to tell what your co-pay for medications will be in advance as this is different depending on your insurance coverage. However, we may be able to find a substitute medication at lower cost or fill out paperwork to get insurance to cover a needed medication.   If a prior authorization is required to get your medication covered   by your insurance company, please allow us 1-2 business days to complete this process.  Drug prices often vary depending on where the prescription is filled and some pharmacies may offer cheaper prices.  The website www.goodrx.com contains coupons for medications through different pharmacies. The prices here do not account for what the cost may be with help from insurance (it may be cheaper with your insurance), but the website can give you the price if you did not use any insurance.  - You can print  the associated coupon and take it with your prescription to the pharmacy.  - You may also stop by our office during regular business hours and pick up a GoodRx coupon card.  - If you need your prescription sent electronically to a different pharmacy, notify our office through [place] MyChart or by phone at [phone] option 4.     Si Usted Necesita Algo Despus de Su Visita  Tambin puede enviarnos un mensaje a travs de MyChart. Por lo general respondemos a los mensajes de MyChart en el transcurso de 1 a 2 das hbiles.  Para renovar recetas, por favor pida a su farmacia que se ponga en contacto con nuestra oficina. Nuestro nmero de fax es el [phone].  Si tiene un asunto urgente cuando la clnica est cerrada y que no puede esperar hasta el siguiente da hbil, puede llamar/localizar a su doctor(a) al nmero que aparece a continuacin.   Por favor, tenga en cuenta que aunque hacemos todo lo posible para estar disponibles para asuntos urgentes fuera del horario de oficina, no estamos disponibles las 24 horas del da, los 7 das de la semana.   Si tiene un problema urgente y no puede comunicarse con nosotros, puede optar por buscar atencin mdica  en el consultorio de su doctor(a), en una clnica privada, en un centro de atencin urgente o en una sala de emergencias.  Si tiene una emergencia mdica, por favor llame inmediatamente al 911 o vaya a la sala de emergencias.  Nmeros de bper  - Dr. Kowalski: [phone]  - Dra. Moye: [phone]  - Dra. Stewart: [phone]  En caso de inclemencias del tiempo, por favor llame a nuestra lnea principal al [phone] para una actualizacin sobre el estado de cualquier retraso o cierre.  Consejos para la medicacin en dermatologa: Por favor, guarde las cajas en las que vienen los medicamentos de uso tpico para ayudarle a seguir las instrucciones sobre dnde y cmo usarlos. Las farmacias generalmente imprimen las  instrucciones del medicamento slo en las cajas y no directamente en los tubos del medicamento.   Si su medicamento es muy caro, por favor, pngase en contacto con nuestra oficina llamando al [phone] y presione la opcin 4 o envenos un mensaje a travs de MyChart.   No podemos decirle cul ser su copago por los medicamentos por adelantado ya que esto es diferente dependiendo de la cobertura de su seguro. Sin embargo, es posible que podamos encontrar un medicamento sustituto a menor costo o llenar un formulario para que el seguro cubra el medicamento que se considera necesario.   Si se requiere una autorizacin previa para que su compaa de seguros cubra su medicamento, por favor permtanos de 1 a 2 das hbiles para completar este proceso.  Los precios de los medicamentos varan con frecuencia dependiendo del lugar de dnde se surte la receta y alguna farmacias pueden ofrecer precios ms baratos.  El sitio web www.goodrx.com tiene cupones para medicamentos de diferentes farmacias. Los precios aqu no   tienen en cuenta lo que podra costar con la ayuda del seguro (puede ser ms barato con su seguro), pero el sitio web puede darle el precio si no utiliz ningn seguro.  - Puede imprimir el cupn correspondiente y llevarlo con su receta a la farmacia.  - Tambin puede pasar por nuestra oficina durante el horario de atencin regular y recoger una tarjeta de cupones de [facility].  - Si necesita que su receta se enve electrnicamente a una farmacia diferente, informe a nuestra oficina a travs de MyChart de [facility] o por telfono llamando al [phone] y presione la opcin 4.  

## 2022-12-27 DIAGNOSIS — F411 Generalized anxiety disorder: Secondary | ICD-10-CM | POA: Diagnosis not present

## 2023-01-06 DIAGNOSIS — F411 Generalized anxiety disorder: Secondary | ICD-10-CM | POA: Diagnosis not present

## 2023-02-07 IMAGING — DX DG CHEST 2V
2 series · 2 of 2 positions shown · non-contrast
Comparison: 01/23/2004

CLINICAL DATA: Chest pain

EXAM:
CHEST - 2 VIEW

[chest pa]
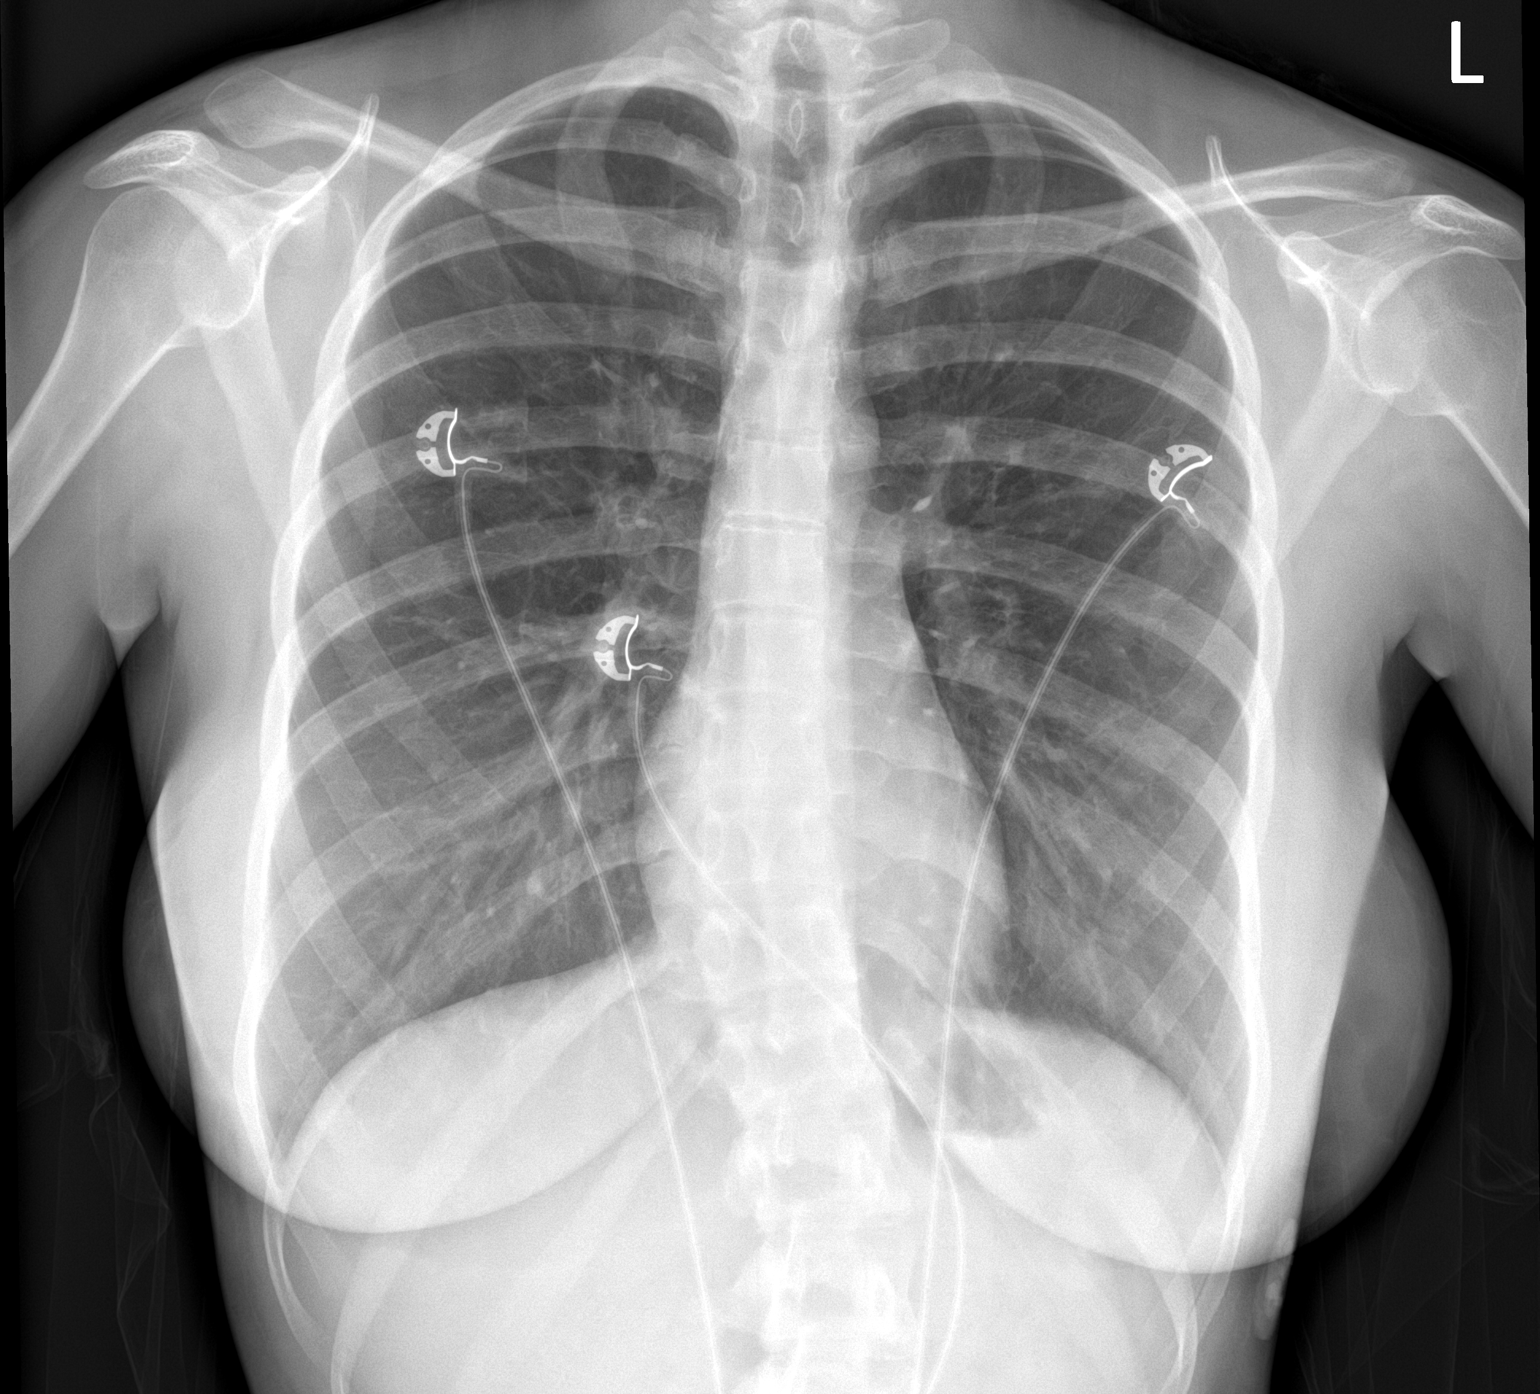

[chest lat]
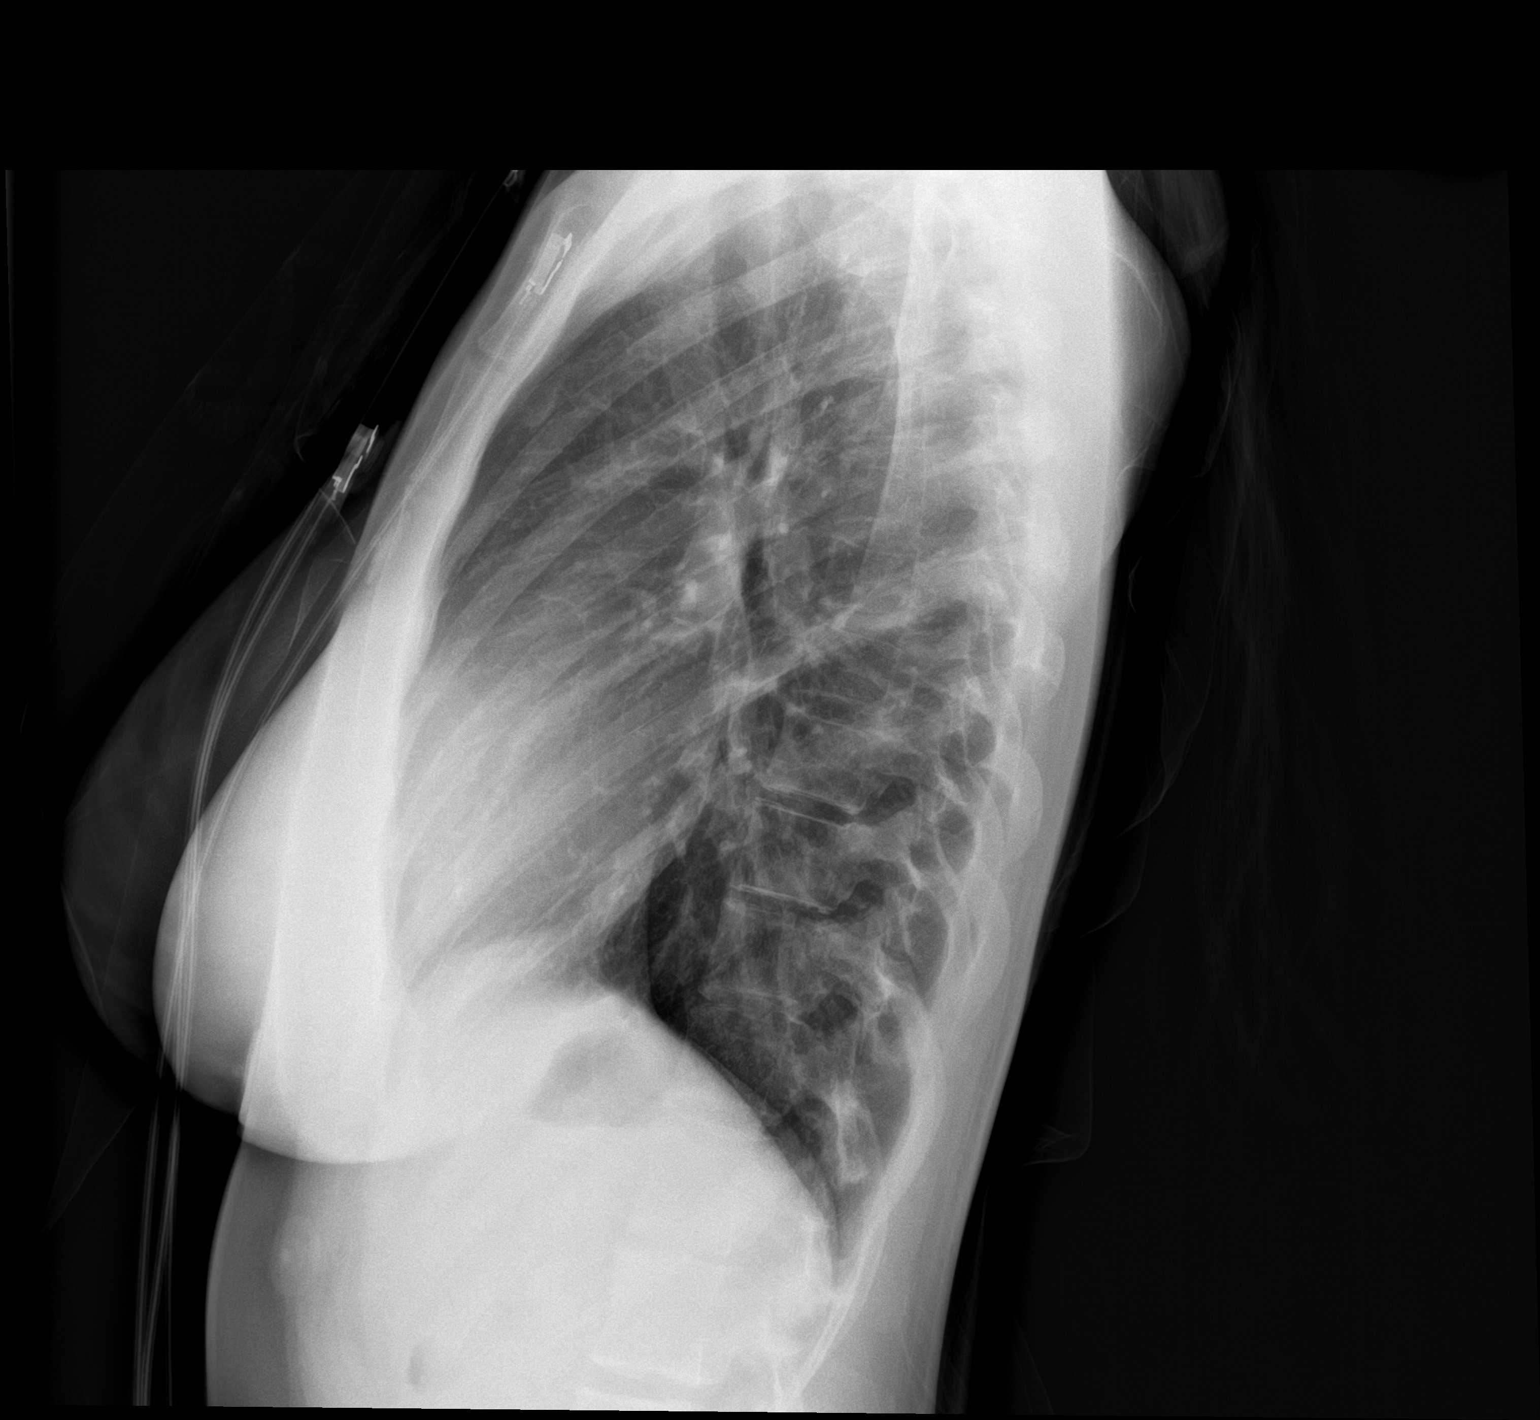

[2 of 2 positions shown; findings below may reference images not displayed]

FINDINGS: The heart size and mediastinal contours are within normal limits.
Both lungs are clear. The visualized skeletal structures are
unremarkable.
IMPRESSION: No active cardiopulmonary disease.

## 2023-02-11 ENCOUNTER — Other Ambulatory Visit (HOSPITAL_BASED_OUTPATIENT_CLINIC_OR_DEPARTMENT_OTHER): Payer: Self-pay

## 2023-02-11 MED ORDER — INFLUENZA VIRUS VACC SPLIT PF (FLUZONE) 0.5 ML IM SUSY
0.5000 mL | PREFILLED_SYRINGE | Freq: Once | INTRAMUSCULAR | 0 refills | Status: AC
  Filled 2023-02-11: qty 0.5, 1d supply, fill #0

## 2023-02-16 ENCOUNTER — Ambulatory Visit (HOSPITAL_COMMUNITY): Payer: Self-pay | Admitting: Student

## 2023-02-23 ENCOUNTER — Ambulatory Visit (HOSPITAL_COMMUNITY): Payer: Self-pay | Admitting: Student

## 2023-04-18 ENCOUNTER — Ambulatory Visit: Payer: 59 | Admitting: Dermatology

## 2023-04-20 ENCOUNTER — Ambulatory Visit (HOSPITAL_COMMUNITY): Payer: Self-pay | Admitting: Student

## 2023-04-21 ENCOUNTER — Other Ambulatory Visit (HOSPITAL_BASED_OUTPATIENT_CLINIC_OR_DEPARTMENT_OTHER): Payer: Self-pay

## 2023-05-02 ENCOUNTER — Other Ambulatory Visit (HOSPITAL_BASED_OUTPATIENT_CLINIC_OR_DEPARTMENT_OTHER): Payer: Self-pay

## 2023-06-30 ENCOUNTER — Ambulatory Visit: Payer: Commercial Managed Care - PPO | Admitting: Family Medicine

## 2023-06-30 ENCOUNTER — Other Ambulatory Visit (HOSPITAL_COMMUNITY): Payer: Self-pay

## 2023-06-30 ENCOUNTER — Encounter: Payer: Self-pay | Admitting: Family Medicine

## 2023-06-30 VITALS — BP 112/68 | HR 96 | Temp 98.3°F | Ht 64.0 in | Wt 122.0 lb

## 2023-06-30 DIAGNOSIS — R41840 Attention and concentration deficit: Secondary | ICD-10-CM

## 2023-06-30 MED ORDER — CLINDAMYCIN PHOS-BENZOYL PEROX 1.2-5 % EX GEL: Freq: Every morning | CUTANEOUS | Status: DC

## 2023-06-30 MED ORDER — CLINDAMYCIN PHOS-BENZOYL PEROX 1.2-5 % EX GEL
Freq: Every morning | CUTANEOUS | 3 refills | Status: DC
  Filled 2023-06-30: qty 45, fill #0

## 2023-06-30 NOTE — Assessment & Plan Note (Signed)
Referral placed for ADHD assessment.

## 2023-06-30 NOTE — Progress Notes (Signed)
Subjective:  HPI: Kayla Rice is a 23 y.o. female presenting on 06/30/2023 for Follow-up (Experiencing trouble with focusing and school)   HPI Patient is in today for trouble focusing throughout the day and especially during school work and with test taking. She is in nursing school. She has experienced these symptoms since childhood. She does not have a history of ADHD or ADD but does have a diagnosis of situational anxiety. Hydroxyzine did not help her symptoms. She has trouble staying on task at home. She would like to be evaluated for ADHD. Difficulty staying on task, easily distracted, worry. Denies SI/HI, panic attacks, hallucinations, insomnia.    Review of Systems  All other systems reviewed and are negative.   Relevant past medical history reviewed and updated as indicated.   Past Medical History:  Diagnosis Date   Asthma    as a child when sick   Seasonal allergies      Past Surgical History:  Procedure Laterality Date   TYMPANOSTOMY TUBE PLACEMENT      Allergies and medications reviewed and updated.   Current Outpatient Medications:    Adapalene (DIFFERIN) 0.3 % gel, Apply a pea sized amount to face every night as tolerated, Disp: 45 g, Rfl: 3   cetirizine (ZYRTEC) 10 MG tablet, Take 1 tablet (10 mg total) by mouth daily., Disp: 30 tablet, Rfl: 5   Clascoterone (WINLEVI) 1 % CREA, Apply 1 Application topically 2 (two) times daily. Bid to face for acne, Disp: 60 g, Rfl: 4   Clindamycin-Benzoyl Per, Refr, gel, Apply to face every morning for acne. (Patient taking differently: in the morning. Ordering provider is dermatologist), Disp: , Rfl:    hydrOXYzine (VISTARIL) 25 MG capsule, Take 2 capsules (50 mg total) by mouth every 6 (six) hours as needed for anxiety. (Patient not taking: Reported on 06/30/2023), Disp: 30 capsule, Rfl: 0   olopatadine (PATANOL) 0.1 % ophthalmic solution, Place 1 drop into both eyes 2 (two) times daily as needed for allergies. (Patient not  taking: Reported on 06/30/2023), Disp: 5 mL, Rfl: 5   spironolactone (ALDACTONE) 50 MG tablet, Take 1 tablet (50 mg total) by mouth 2 (two) times daily. (Patient not taking: Reported on 06/30/2023), Disp: 60 tablet, Rfl: 2  No Known Allergies  Objective:   BP 112/68   Pulse 96   Temp 98.3 F (36.8 C) (Oral)   Ht 5\' 4"  (1.626 m)   Wt 122 lb (55.3 kg)   LMP 04/29/2023   SpO2 98%   BMI 20.94 kg/m      06/30/2023    3:41 PM 11/22/2022   11:48 AM 11/03/2022    9:14 AM  Vitals with BMI  Height 5\' 4"   5\' 4"   Weight 122 lbs  128 lbs  BMI 20.93  21.96  Systolic 112 112 161  Diastolic 68 75 62  Pulse 96  87     Physical Exam Vitals and nursing note reviewed.  Constitutional:      Appearance: Normal appearance. She is normal weight.  HENT:     Head: Normocephalic and atraumatic.  Skin:    General: Skin is warm and dry.  Neurological:     General: No focal deficit present.     Mental Status: She is alert and oriented to person, place, and time. Mental status is at baseline.  Psychiatric:        Mood and Affect: Mood normal.        Behavior: Behavior normal.  Thought Content: Thought content normal.        Judgment: Judgment normal.     Assessment & Plan:  Attention and concentration deficit Assessment & Plan: Referral placed for ADHD assessment  Orders: -     Ambulatory referral to Psychology  Other orders -     Clindamycin Phos-Benzoyl Perox; Apply to face every morning for acne. (Patient taking differently: in the morning. Ordering provider is dermatologist)     Follow up plan: Return if symptoms worsen or fail to improve.  Park Meo, FNP

## 2023-07-01 ENCOUNTER — Telehealth: Payer: Self-pay

## 2023-07-01 NOTE — Telephone Encounter (Signed)
Call [facility] Atten Specialist, LVM to call back re processes for referring pts to their office.

## 2023-07-07 ENCOUNTER — Ambulatory Visit: Payer: Commercial Managed Care - PPO | Admitting: Family Medicine

## 2023-08-12 DIAGNOSIS — F4322 Adjustment disorder with anxiety: Secondary | ICD-10-CM | POA: Diagnosis not present

## 2023-08-19 DIAGNOSIS — F4322 Adjustment disorder with anxiety: Secondary | ICD-10-CM | POA: Diagnosis not present

## 2023-08-26 ENCOUNTER — Encounter: Payer: Self-pay | Admitting: Radiology

## 2023-08-26 ENCOUNTER — Ambulatory Visit: Admitting: Radiology

## 2023-08-26 VITALS — BP 100/60 | HR 63 | Wt 121.6 lb

## 2023-08-26 DIAGNOSIS — N926 Irregular menstruation, unspecified: Secondary | ICD-10-CM

## 2023-08-26 NOTE — Progress Notes (Signed)
   Kayla Rice 11/26/2000 [id number]   History:  23 y.o. Consult to discuss hormones. Complains of irregular periods since stopping OCPs 04/2022. 120-250 day cycles. Denies any weight gain, hair growth. Does have some acne.   Gynecologic History Patient's last menstrual period was 08/26/2023 (exact date). Period Duration (Days): 7 Period Pattern: (!) Irregular (120-250 day cycles) Menstrual Flow: Moderate, Heavy Menstrual Control: Maxi pad, Thin pad, Tampon Dysmenorrhea: (!) Moderate Dysmenorrhea Symptoms: Cramping Contraception/Family planning: condoms Sexually active: yes   Obstetric History OB History  Gravida Para Term Preterm AB Living  0 0 0 0 0 0  SAB IAB Ectopic Multiple Live Births  0 0 0 0 0       06/30/2023    3:46 PM 11/03/2022    9:16 AM  Depression screen PHQ 2/9  Decreased Interest 1 0  Down, Depressed, Hopeless 0 0  PHQ - 2 Score 1 0  Altered sleeping 0 0  Tired, decreased energy 0 0  Change in appetite 0 0  Feeling bad or failure about yourself  0 0  Trouble concentrating 3 0  Moving slowly or fidgety/restless 2 0  Suicidal thoughts 0 0  PHQ-9 Score 6 0  Difficult doing work/chores Very difficult Not difficult at all     The following portions of the patient's history were reviewed and updated as appropriate: allergies, current medications, past family history, past medical history, past social history, past surgical history, and problem list.  Review of Systems  All other systems reviewed and are negative.   Past medical history, past surgical history, family history and social history were all reviewed and documented in the EPIC chart.  Exam:  Vitals:   08/26/23 1042  BP: 100/60  Pulse: 63  SpO2: 96%  Weight: 121 lb 9.6 oz (55.2 kg)   Body mass index is 20.87 kg/m.  Physical Exam Vitals and nursing note reviewed.  Constitutional:      Appearance: Normal appearance. She is normal weight.  Pulmonary:     Effort: Pulmonary effort  is normal.  Neurological:     Mental Status: She is alert.  Psychiatric:        Mood and Affect: Mood normal.        Thought Content: Thought content normal.        Judgment: Judgment normal.      Raynelle Fanning, CMA present for exam  Assessment/Plan:   1. Irregular periods (Primary) - 17-Hydroxyprogesterone - Follicle stimulating hormone - Estradiol - Luteinizing hormone - Testos,Total,Free and SHBG (Female) - TSH - Prolactin - Hemoglobin A1c  Continue condoms for [facility]  Return in about 2 weeks (around 09/09/2023) for discuss labs.  Arlie Solomons B WHNP-BC 11:23 AM 08/26/2023

## 2023-08-30 ENCOUNTER — Other Ambulatory Visit (HOSPITAL_COMMUNITY): Payer: Self-pay

## 2023-08-31 ENCOUNTER — Other Ambulatory Visit: Payer: Self-pay

## 2023-08-31 ENCOUNTER — Other Ambulatory Visit (HOSPITAL_COMMUNITY): Payer: Self-pay

## 2023-08-31 ENCOUNTER — Encounter: Payer: Self-pay | Admitting: Pharmacist

## 2023-08-31 LAB — FOLLICLE STIMULATING HORMONE: FSH: 5.4 m[IU]/mL

## 2023-08-31 LAB — TESTOS,TOTAL,FREE AND SHBG (FEMALE)
Free Testosterone: 2.8 pg/mL (ref 0.1–6.4)
Sex Hormone Binding: 54.5 nmol/L (ref 17–124)
Testosterone, Total, LC-MS-MS: 23 ng/dL (ref 2–45)

## 2023-08-31 LAB — HEMOGLOBIN A1C
Hgb A1c MFr Bld: 4.9 %{Hb} (ref <5.7)
Mean Plasma Glucose: 94 mg/dL
eAG (mmol/L): 5.2 mmol/L

## 2023-08-31 LAB — ESTRADIOL: Estradiol: 27 pg/mL

## 2023-08-31 LAB — TSH: TSH: 1.03 m[IU]/L

## 2023-08-31 LAB — PROLACTIN: Prolactin: 11.6 ng/mL

## 2023-08-31 LAB — LUTEINIZING HORMONE: LH: 6.5 m[IU]/mL

## 2023-09-02 DIAGNOSIS — F4322 Adjustment disorder with anxiety: Secondary | ICD-10-CM | POA: Diagnosis not present

## 2023-09-05 ENCOUNTER — Other Ambulatory Visit: Payer: Self-pay

## 2023-09-05 LAB — 17-HYDROXYPROGESTERONE: 17-OH-Progesterone, LC/MS/MS: 34 ng/dL

## 2023-09-09 DIAGNOSIS — F4322 Adjustment disorder with anxiety: Secondary | ICD-10-CM | POA: Diagnosis not present

## 2023-09-13 ENCOUNTER — Ambulatory Visit: Admitting: Radiology

## 2023-09-13 ENCOUNTER — Encounter: Payer: Self-pay | Admitting: Radiology

## 2023-09-13 VITALS — BP 99/89 | HR 89 | Wt 120.4 lb

## 2023-09-13 DIAGNOSIS — N914 Secondary oligomenorrhea: Secondary | ICD-10-CM

## 2023-09-13 NOTE — Progress Notes (Signed)
   Kayla Rice 12-Jun-2000 [id number]   History:  23 y.o. Consult to discuss hormone testing completed at her last visit. Complains of irregular periods since stopping OCPs 04/2022. 120-250 day cycles. Denies any weight gain, hair growth. Does have some acne.   Gynecologic History Patient's last menstrual period was 08/26/2023 (exact date).   Contraception/Family planning: condoms Sexually active: yes   Obstetric History OB History  Gravida Para Term Preterm AB Living  0 0 0 0 0 0  SAB IAB Ectopic Multiple Live Births  0 0 0 0 0       06/30/2023    3:46 PM 11/03/2022    9:16 AM  Depression screen PHQ 2/9  Decreased Interest 1 0  Down, Depressed, Hopeless 0 0  PHQ - 2 Score 1 0  Altered sleeping 0 0  Tired, decreased energy 0 0  Change in appetite 0 0  Feeling bad or failure about yourself  0 0  Trouble concentrating 3 0  Moving slowly or fidgety/restless 2 0  Suicidal thoughts 0 0  PHQ-9 Score 6 0  Difficult doing work/chores Very difficult Not difficult at all     The following portions of the patient's history were reviewed and updated as appropriate: allergies, current medications, past family history, past medical history, past social history, past surgical history, and problem list.  Review of Systems  All other systems reviewed and are negative.    Latest Reference Range & Units 08/26/23 11:14  LH mIU/mL 6.5  FSH mIU/mL 5.4  Prolactin ng/mL 11.6  eAG (mmol/L) mmol/L 5.2  Hemoglobin A1C <5.7 % of total Hgb 4.9  Estradiol  pg/mL 27  Free Testosterone 0.1 - 6.4 pg/mL 2.8  Sex Horm Binding Glob, Serum 17 - 124 nmol/L 54.5  Testosterone, Total, LC-MS-MS 2 - 45 ng/dL 23  [id number], LC/MS/MS  ng/dL 34  TSH mIU/L 1.30    Past medical history, past surgical history, family history and social history were all reviewed and documented in the EPIC chart.  Exam:  Vitals:   09/13/23 1054  BP: 99/89  Pulse: 89  SpO2: 99%  Weight: 120 lb 6.4 oz (54.6  kg)   Body mass index is 20.67 kg/m.  Physical Exam Vitals and nursing note reviewed.  Constitutional:      Appearance: Normal appearance. She is normal weight.  Pulmonary:     Effort: Pulmonary effort is normal.  Neurological:     Mental Status: She is alert.  Psychiatric:        Mood and Affect: Mood normal.        Thought Content: Thought content normal.        Judgment: Judgment normal.      Ellis Guys, CMA present for exam    Assessment/Plan:   1. Secondary oligomenorrhea (Primary) - US  Transvaginal Non-OB; Future   Return for u/s only. Will call with results. Ideally would like to avoid going back on birth control. Would be open to using provera prn.  Synetta Eves B WHNP-BC 11:09 AM 09/13/2023

## 2023-09-14 ENCOUNTER — Encounter: Payer: Self-pay | Admitting: Family Medicine

## 2023-09-14 ENCOUNTER — Ambulatory Visit: Admitting: Family Medicine

## 2023-09-14 VITALS — BP 102/68 | HR 100 | Ht 64.0 in | Wt 121.2 lb

## 2023-09-14 DIAGNOSIS — Z0000 Encounter for general adult medical examination without abnormal findings: Secondary | ICD-10-CM

## 2023-09-14 NOTE — Progress Notes (Signed)
 Complete physical exam  Patient: Kayla Rice   DOB: 2000-11-29   22 y.o. Female  MRN: [id number]  Subjective:    Chief Complaint  Patient presents with   bloodwork    Needs TB, hep b titer, mmr titer, varicella titer     Kayla Rice is a 23 y.o. female who presents today for a complete physical exam and school paperwork with labs. She reports consuming a general diet. The patient does not participate in regular exercise at present. She generally feels well. She reports sleeping well. She does not have additional problems to discuss today.    Most recent fall risk assessment:    06/29/2022    9:50 AM  Fall Risk   Falls in the past year? 0  Number falls in past yr: 0  Injury with Fall? 0  Risk for fall due to : No Fall Risks     Most recent depression screenings:    06/30/2023    3:46 PM 11/03/2022    9:16 AM  PHQ 2/9 Scores  PHQ - 2 Score 1 0  PHQ- 9 Score 6 0    Dental: No current dental problems  Patient Active Problem List   Diagnosis Date Noted   Attention and concentration deficit 06/30/2023   Situational anxiety 11/03/2022   Physical exam, annual 09/22/2022   Past Medical History:  Diagnosis Date   Asthma    as a child when sick   Seasonal allergies    Past Surgical History:  Procedure Laterality Date   TYMPANOSTOMY TUBE PLACEMENT     Social History   Tobacco Use   Smoking status: Never    Passive exposure: Never   Smokeless tobacco: Never  Vaping Use   Vaping status: Never Used  Substance Use Topics   Alcohol use: Never   Drug use: Never   Family History  Problem Relation Age of Onset   Diabetes Maternal Grandmother    Hypertension Maternal Grandmother    Thyroid disease Maternal Grandmother    Heart attack Maternal Grandmother    Prostate cancer Maternal Grandfather    Breast cancer Other        great paternal grandmother   Healthy Mother    Healthy Father    No Known Allergies    Patient Care Team: Kayla Mis, FNP  as PCP - General (Family Medicine)   Outpatient Medications Prior to Visit  Medication Sig   Adapalene  (DIFFERIN ) 0.3 % gel Apply a pea sized amount to face every night as tolerated   cetirizine  (ZYRTEC ) 10 MG tablet Take 1 tablet (10 mg total) by mouth daily.   Clascoterone  (WINLEVI ) 1 % CREA Apply 1 Application topically 2 (two) times daily. Bid to face for acne (Patient not taking: Reported on 09/14/2023)   Clindamycin -Benzoyl Per, Refr, gel Apply to face every morning for acne. (Patient taking differently: in the morning. Ordering provider is dermatologist)   hydrOXYzine  (VISTARIL ) 25 MG capsule Take 2 capsules (50 mg total) by mouth every 6 (six) hours as needed for anxiety. (Patient not taking: Reported on 09/14/2023)   olopatadine  (PATANOL) 0.1 % ophthalmic solution Place 1 drop into both eyes 2 (two) times daily as needed for allergies. (Patient not taking: Reported on 09/14/2023)   spironolactone  (ALDACTONE ) 50 MG tablet Take 1 tablet (50 mg total) by mouth 2 (two) times daily. (Patient not taking: Reported on 09/14/2023)   No facility-administered medications prior to visit.    Review of Systems  Constitutional: Negative.  HENT: Negative.    Eyes: Negative.   Respiratory: Negative.    Cardiovascular: Negative.   Gastrointestinal: Negative.   Genitourinary: Negative.   Musculoskeletal: Negative.   Skin: Negative.   Neurological: Negative.   Endo/Heme/Allergies: Negative.   Psychiatric/Behavioral: Negative.    All other systems reviewed and are negative.         Objective:     BP 102/68   Pulse 100   Ht 5\' 4"  (1.626 m)   Wt 121 lb 4 oz (55 kg)   LMP 08/26/2023 (Exact Date)   SpO2 99%   BMI 20.81 kg/m     Physical Exam Vitals and nursing note reviewed.  Constitutional:      Appearance: Normal appearance. She is normal weight.  HENT:     Head: Normocephalic and atraumatic.     Right Ear: Tympanic membrane, ear canal and external ear normal.     Left Ear:  Tympanic membrane, ear canal and external ear normal.     Nose: Nose normal.     Mouth/Throat:     Mouth: Mucous membranes are moist.     Pharynx: Oropharynx is clear.  Eyes:     Extraocular Movements: Extraocular movements intact.     Conjunctiva/sclera: Conjunctivae normal.     Pupils: Pupils are equal, round, and reactive to light.  Cardiovascular:     Rate and Rhythm: Normal rate and regular rhythm.     Pulses: Normal pulses.     Heart sounds: Normal heart sounds.  Pulmonary:     Effort: Pulmonary effort is normal.     Breath sounds: Normal breath sounds.  Abdominal:     General: Bowel sounds are normal.     Palpations: Abdomen is soft.  Musculoskeletal:        General: Normal range of motion.     Cervical back: Normal range of motion and neck supple.  Skin:    General: Skin is warm and dry.     Capillary Refill: Capillary refill takes less than 2 seconds.  Neurological:     General: No focal deficit present.     Mental Status: She is alert and oriented to person, place, and time. Mental status is at baseline.  Psychiatric:        Mood and Affect: Mood normal.        Behavior: Behavior normal.        Thought Content: Thought content normal.        Judgment: Judgment normal.      No results found for any visits on 09/14/23.     Assessment & Plan:    Routine Health Maintenance and Physical Exam  Immunization History  Administered Date(s) Administered   DTaP 05/17/2014   HPV Quadrivalent 05/17/2014   Hepb-cpg 10/06/2022   Influenza, Seasonal, Injecte, Preservative Fre 02/11/2023   PFIZER(Purple Top)SARS-COV-2 Vaccination 12/21/2019, 01/08/2020    Health Maintenance  Topic Date Due   HPV VACCINES (2 - Risk 3-dose series) 06/14/2014   Meningococcal B Vaccine (1 of 2 - Standard) Never done   COVID-19 Vaccine (3 - Pfizer risk series) 02/05/2020   CHLAMYDIA SCREENING  09/22/2023 (Originally 02/19/2018)   INFLUENZA VACCINE  12/16/2023   DTaP/Tdap/Td (2 - Tdap)  05/17/2024   Hepatitis C Screening  Completed   HIV Screening  Completed    Discussed health benefits of physical activity, and encouraged her to engage in regular exercise appropriate for her age and condition.  Problem List Items Addressed This Visit  Physical exam, annual - Primary   Today your medical history was reviewed and routine physical exam with labs was performed. Recommend 150 minutes of moderate intensity exercise weekly and consuming a well-balanced diet. Advised to stop smoking if a smoker, avoid smoking if a non-smoker, limit alcohol consumption to 1 drink per day for women and 2 drinks per day for men, and avoid illicit drug use. Vaccine maintenance discussed. Appropriate health maintenance items reviewed. Return to office in 1 year for annual physical exam.       Relevant Orders   Measles/Mumps/Rubella Immunity   Varicella zoster antibody, IgG   Hepatitis B surface antibody,quantitative   CBC with Differential/Platelet   Comprehensive metabolic panel with GFR   Lipid panel   QuantiFERON-TB Gold Plus   Return in about 1 year (around 09/13/2024) for annual physical with labs 1 week prior.     Kayla Mis, FNP

## 2023-09-14 NOTE — Assessment & Plan Note (Signed)
Today your medical history was reviewed and routine physical exam with labs was performed. Recommend 150 minutes of moderate intensity exercise weekly and consuming a well-balanced diet. Advised to stop smoking if a smoker, avoid smoking if a non-smoker, limit alcohol consumption to 1 drink per day for women and 2 drinks per day for men, and avoid illicit drug use. Vaccine maintenance discussed. Appropriate health maintenance items reviewed. Return to office in 1 year for annual physical exam.

## 2023-09-15 LAB — CBC WITH DIFFERENTIAL/PLATELET
Absolute Lymphocytes: 1848 {cells}/uL (ref 850–3900)
Absolute Monocytes: 403 {cells}/uL (ref 200–950)
Basophils Absolute: 49 {cells}/uL (ref 0–200)
Basophils Relative: 0.8 %
Eosinophils Absolute: 281 {cells}/uL (ref 15–500)
Eosinophils Relative: 4.6 %
HCT: 39.1 % (ref 35.0–45.0)
Hemoglobin: 13.3 g/dL (ref 11.7–15.5)
MCH: 30.9 pg (ref 27.0–33.0)
MCHC: 34 g/dL (ref 32.0–36.0)
MCV: 90.7 fL (ref 80.0–100.0)
MPV: 11.5 fL (ref 7.5–12.5)
Monocytes Relative: 6.6 %
Neutro Abs: 3520 {cells}/uL (ref 1500–7800)
Neutrophils Relative %: 57.7 %
Platelets: 258 10*3/uL (ref 140–400)
RBC: 4.31 10*6/uL (ref 3.80–5.10)
RDW: 11.9 % (ref 11.0–15.0)
Total Lymphocyte: 30.3 %
WBC: 6.1 10*3/uL (ref 3.8–10.8)

## 2023-09-15 LAB — COMPREHENSIVE METABOLIC PANEL WITH GFR
AG Ratio: 1.9 (calc) (ref 1.0–2.5)
ALT: 8 U/L (ref 6–29)
AST: 12 U/L (ref 10–30)
Albumin: 4.7 g/dL (ref 3.6–5.1)
Alkaline phosphatase (APISO): 56 U/L (ref 31–125)
BUN: 12 mg/dL (ref 7–25)
CO2: 26 mmol/L (ref 20–32)
Calcium: 9.8 mg/dL (ref 8.6–10.2)
Chloride: 105 mmol/L (ref 98–110)
Creat: 0.74 mg/dL (ref 0.50–0.96)
Globulin: 2.5 g/dL (ref 1.9–3.7)
Glucose, Bld: 76 mg/dL (ref 65–99)
Potassium: 4.2 mmol/L (ref 3.5–5.3)
Sodium: 141 mmol/L (ref 135–146)
Total Bilirubin: 0.5 mg/dL (ref 0.2–1.2)
Total Protein: 7.2 g/dL (ref 6.1–8.1)
eGFR: 119 mL/min/{1.73_m2} (ref >=60)

## 2023-09-15 LAB — MEASLES/MUMPS/RUBELLA IMMUNITY
Mumps IgG: 161 [AU]/ml
Rubella: 1.16 {index}
Rubeola IgG: 192 [AU]/ml

## 2023-09-15 LAB — LIPID PANEL
Cholesterol: 133 mg/dL (ref <200)
HDL: 58 mg/dL (ref >=50)
LDL Cholesterol (Calc): 62 mg/dL
Non-HDL Cholesterol (Calc): 75 mg/dL (ref <130)
Total CHOL/HDL Ratio: 2.3 (calc) (ref <5.0)
Triglycerides: 57 mg/dL (ref <150)

## 2023-09-15 LAB — VARICELLA ZOSTER ANTIBODY, IGG: Varicella IgG: 1.88 {s_co_ratio}

## 2023-09-15 LAB — HEPATITIS B SURFACE ANTIBODY, QUANTITATIVE: Hep B S AB Quant (Post): 66 m[IU]/mL (ref >=10)

## 2023-09-16 LAB — QUANTIFERON-TB GOLD PLUS
Mitogen-NIL: 7.75 [IU]/mL
NIL: 0.02 [IU]/mL
QuantiFERON-TB Gold Plus: NEGATIVE
TB1-NIL: <0 [IU]/mL
TB2-NIL: <0 [IU]/mL

## 2023-09-22 ENCOUNTER — Ambulatory Visit (INDEPENDENT_AMBULATORY_CARE_PROVIDER_SITE_OTHER)

## 2023-09-22 ENCOUNTER — Other Ambulatory Visit (HOSPITAL_COMMUNITY): Payer: Self-pay

## 2023-09-22 ENCOUNTER — Other Ambulatory Visit: Payer: Self-pay

## 2023-09-22 DIAGNOSIS — N914 Secondary oligomenorrhea: Secondary | ICD-10-CM | POA: Diagnosis not present

## 2023-09-22 DIAGNOSIS — N926 Irregular menstruation, unspecified: Secondary | ICD-10-CM

## 2023-09-22 MED ORDER — MEDROXYPROGESTERONE ACETATE 10 MG PO TABS
10.0000 mg | ORAL_TABLET | Freq: Every day | ORAL | 2 refills | Status: AC
Start: 2023-09-22 — End: ?
  Filled 2023-09-22: qty 10, 10d supply, fill #0

## 2023-09-23 DIAGNOSIS — F4322 Adjustment disorder with anxiety: Secondary | ICD-10-CM | POA: Diagnosis not present

## 2023-09-27 ENCOUNTER — Other Ambulatory Visit (HOSPITAL_BASED_OUTPATIENT_CLINIC_OR_DEPARTMENT_OTHER): Payer: Self-pay

## 2023-09-27 ENCOUNTER — Ambulatory Visit (INDEPENDENT_AMBULATORY_CARE_PROVIDER_SITE_OTHER): Admitting: Dermatology

## 2023-09-27 ENCOUNTER — Other Ambulatory Visit: Payer: Self-pay

## 2023-09-27 DIAGNOSIS — Z79899 Other long term (current) drug therapy: Secondary | ICD-10-CM

## 2023-09-27 DIAGNOSIS — L700 Acne vulgaris: Secondary | ICD-10-CM

## 2023-09-27 MED ORDER — TAZAROTENE 0.05 % EX CREA
TOPICAL_CREAM | CUTANEOUS | 3 refills | Status: DC
  Filled 2023-09-27: qty 30, 30d supply, fill #0
  Filled 2024-05-01: qty 30, 30d supply, fill #1

## 2023-09-27 NOTE — Progress Notes (Unsigned)
   Follow-Up Visit   Subjective  Kayla Rice is a 23 y.o. female who presents for the following: Acne Vulgaris of the face. She is using adapalene  0.3% gel at bedtime and started a benzoyl peroxide  wash. She tried Winlevi  and Duac Gel, but acne wasn't improving. She has been on Spironolactone  in the past, but had to d/c due to making her dizzy. Acne is better, but not where patient would like to have it.    The following portions of the chart were reviewed this encounter and updated as appropriate: medications, allergies, medical history  Review of Systems:  No other skin or systemic complaints except as noted in HPI or Assessment and Plan.  Objective  Well appearing patient in no apparent distress; mood and affect are within normal limits.  Areas Examined: Face, chest and back  Relevant exam findings are noted in the Assessment and Plan.   Assessment & Plan    ACNE VULGARIS Exam: Violaceous macules on cheeks; closed comedones on forehead, cheeks, temples  Chronic and persistent condition with duration or expected duration over one year. Condition is symptomatic/ bothersome to patient. Not currently at goal.   Treatment Plan: D/c adapalene  0.3% gel Start tazarotene 0.05% cream nightly as tolerated dsp 30g 3Rf. Apply light moisturizer first.  Topical retinoid medications like tretinoin/Retin-A, adapalene /Differin , tazarotene/Fabior, and Epiduo/Epiduo Forte can cause dryness and irritation when first started. Only apply a pea-sized amount to the entire affected area. Avoid applying it around the eyes, edges of mouth and creases at the nose. If you experience irritation, use a good moisturizer first and/or apply the medicine less often. If you are doing well with the medicine, you can increase how often you use it until you are applying every night. Be careful with sun protection while using this medication as it can make you sensitive to the sun. This medicine should not be used by  pregnant women.   Recommend non-drying cleanser. If tolerating tazarotene cream ok, may add medicated cleanser (samples of CeraVe Sal Acid and BP).  Return in about 6 months (around 03/29/2024) for Acne.  IBernardine Bridegroom, CMA, am acting as scribe for Artemio Larry, MD .   Documentation: I have reviewed the above documentation for accuracy and completeness, and I agree with the above.  Artemio Larry, MD

## 2023-09-27 NOTE — Patient Instructions (Signed)
 Topical retinoid medications like tretinoin/Retin-A, adapalene /Differin , tazarotene/Fabior, and Epiduo/Epiduo Forte can cause dryness and irritation when first started. Only apply a pea-sized amount to the entire affected area. Avoid applying it around the eyes, edges of mouth and creases at the nose. If you experience irritation, use a good moisturizer first and/or apply the medicine less often. If you are doing well with the medicine, you can increase how often you use it until you are applying every night. Be careful with sun protection while using this medication as it can make you sensitive to the sun. This medicine should not be used by pregnant women.   Benzoyl peroxide  can cause dryness and irritation of the skin. It can also bleach fabric.    Due to recent changes in healthcare laws, you may see results of your pathology and/or laboratory studies on MyChart before the doctors have had a chance to review them. We understand that in some cases there may be results that are confusing or concerning to you. Please understand that not all results are received at the same time and often the doctors may need to interpret multiple results in order to provide you with the best plan of care or course of treatment. Therefore, we ask that you please give us  2 business days to thoroughly review all your results before contacting the office for clarification. Should we see a critical lab result, you will be contacted sooner.   If You Need Anything After Your Visit  If you have any questions or concerns for your doctor, please call our main line at [phone] and press option 4 to reach your doctor's medical assistant. If no one answers, please leave a voicemail as directed and we will return your call as soon as possible. Messages left after 4 pm will be answered the following business day.   You may also send us  a message via MyChart. We typically respond to MyChart messages within 1-2 business days.  For  prescription refills, please ask your pharmacy to contact our office. Our fax number is [phone].  If you have an urgent issue when the clinic is closed that cannot wait until the next business day, you can page your doctor at the number below.    Please note that while we do our best to be available for urgent issues outside of office hours, we are not available 24/7.   If you have an urgent issue and are unable to reach us , you may choose to seek medical care at your doctor's office, retail clinic, urgent care center, or emergency room.  If you have a medical emergency, please immediately call 911 or go to the emergency department.  Pager Numbers  - Dr. Bary Likes: [phone]  - Dr. Annette Barters: [phone]  - Dr. Felipe Horton: [phone]   In the event of inclement weather, please call our main line at [phone] for an update on the status of any delays or closures.  Dermatology Medication Tips: Please keep the boxes that topical medications come in in order to help keep track of the instructions about where and how to use these. Pharmacies typically print the medication instructions only on the boxes and not directly on the medication tubes.   If your medication is too expensive, please contact our office at [phone] option 4 or send us  a message through MyChart.   We are unable to tell what your co-pay for medications will be in advance as this is different depending on your insurance coverage. However, we may be able  to find a substitute medication at lower cost or fill out paperwork to get insurance to cover a needed medication.   If a prior authorization is required to get your medication covered by your insurance company, please allow us  1-2 business days to complete this process.  Drug prices often vary depending on where the prescription is filled and some pharmacies may offer cheaper prices.  The website www.goodrx.com contains coupons for medications through different  pharmacies. The prices here do not account for what the cost may be with help from insurance (it may be cheaper with your insurance), but the website can give you the price if you did not use any insurance.  - You can print the associated coupon and take it with your prescription to the pharmacy.  - You may also stop by our office during regular business hours and pick up a GoodRx coupon card.  - If you need your prescription sent electronically to a different pharmacy, notify our office through [facility] or by phone at [phone] option 4.     Si Usted Necesita Algo Despus de Su Visita  Tambin puede enviarnos un mensaje a travs de Clinical cytogeneticist. Por lo general respondemos a los mensajes de MyChart en el transcurso de 1 a 2 das hbiles.  Para renovar recetas, por favor pida a su farmacia que se ponga en contacto con nuestra oficina. Franz Jacks de fax es [place] [phone].  Si tiene un asunto urgente cuando la clnica est cerrada y que no puede esperar hasta el siguiente da hbil, puede llamar/localizar a su doctor(a) al nmero que aparece a continuacin.   Por favor, tenga en cuenta que aunque hacemos todo lo posible para estar disponibles para asuntos urgentes fuera del horario de [place], no estamos disponibles las 24 horas del da, los 7 [address]  [address] de la [place].   Si tiene un problema urgente y no puede comunicarse con nosotros, puede optar por buscar atencin mdica  en el consultorio de su doctor(a), en una clnica privada, en un centro de atencin urgente o en una sala de emergencias.  Si tiene Engineer, drilling, por favor llame inmediatamente al 911 o vaya a la sala de emergencias.  Nmeros de bper  - Dr. Bary Likes: [phone]  - Dra. Annette Barters: [phone]  - Dr. Felipe Horton: [phone]   En caso de inclemencias del tiempo, por favor llame a Lajuan Pila principal al [phone] para una actualizacin sobre el [place] de cualquier retraso o cierre.  Consejos para la  medicacin en dermatologa: Por favor, guarde las cajas en las que vienen los medicamentos de uso tpico para ayudarle a seguir las instrucciones sobre dnde y cmo usarlos. Las farmacias generalmente imprimen las instrucciones del medicamento slo en las cajas y no directamente en los tubos del [place].   Si su medicamento es muy caro, por favor, pngase en contacto con Bettyjane Brunet llamando al [phone] y presione la opcin 4 o envenos un mensaje a travs de Clinical cytogeneticist.   No podemos decirle cul ser su copago por los medicamentos por adelantado ya que esto es diferente dependiendo de la cobertura de su seguro. Sin embargo, es posible que podamos encontrar un medicamento sustituto a Audiological scientist un formulario para que el seguro cubra el medicamento que se considera necesario.   Si se requiere una autorizacin previa para que su compaa de seguros [place] su medicamento, por favor permtanos de 1 a 2 das hbiles para completar este proceso.  Los precios de los medicamentos varan con  frecuencia dependiendo del lugar de dnde se surte la receta y alguna farmacias pueden ofrecer precios ms baratos.  El sitio web www.goodrx.com tiene cupones para medicamentos de Health and safety inspector. Los precios aqu no tienen en cuenta lo que podra costar con la ayuda del seguro (puede ser ms barato con su seguro), pero el sitio web puede darle el precio si no utiliz Tourist information centre manager.  - Puede imprimir el cupn correspondiente y llevarlo con su receta a la farmacia.  - Tambin puede pasar por nuestra oficina durante el horario de atencin regular y Education officer, museum una tarjeta de cupones de GoodRx.  - Si necesita que su receta se enve electrnicamente a una farmacia diferente, informe a nuestra oficina a travs de MyChart de [place] o por telfono llamando al [phone] y presione la opcin 4.

## 2023-09-28 ENCOUNTER — Other Ambulatory Visit (HOSPITAL_BASED_OUTPATIENT_CLINIC_OR_DEPARTMENT_OTHER): Payer: Self-pay

## 2023-09-30 DIAGNOSIS — F4322 Adjustment disorder with anxiety: Secondary | ICD-10-CM | POA: Diagnosis not present

## 2023-10-04 DIAGNOSIS — Z79899 Other long term (current) drug therapy: Secondary | ICD-10-CM | POA: Diagnosis not present

## 2023-10-04 DIAGNOSIS — R41840 Attention and concentration deficit: Secondary | ICD-10-CM | POA: Diagnosis not present

## 2023-10-11 DIAGNOSIS — F4322 Adjustment disorder with anxiety: Secondary | ICD-10-CM | POA: Diagnosis not present

## 2023-10-13 ENCOUNTER — Other Ambulatory Visit (HOSPITAL_BASED_OUTPATIENT_CLINIC_OR_DEPARTMENT_OTHER): Payer: Self-pay

## 2023-10-13 DIAGNOSIS — Z79899 Other long term (current) drug therapy: Secondary | ICD-10-CM | POA: Diagnosis not present

## 2023-10-13 DIAGNOSIS — R41840 Attention and concentration deficit: Secondary | ICD-10-CM | POA: Diagnosis not present

## 2023-10-13 MED ORDER — LISDEXAMFETAMINE DIMESYLATE 20 MG PO CAPS
20.0000 mg | ORAL_CAPSULE | Freq: Every morning | ORAL | 0 refills | Status: DC
  Filled 2023-10-13: qty 30, 30d supply, fill #0

## 2023-10-17 ENCOUNTER — Other Ambulatory Visit (HOSPITAL_BASED_OUTPATIENT_CLINIC_OR_DEPARTMENT_OTHER): Payer: Self-pay

## 2023-10-17 ENCOUNTER — Ambulatory Visit: Admitting: Family Medicine

## 2023-10-17 ENCOUNTER — Encounter: Payer: Self-pay | Admitting: Family Medicine

## 2023-10-17 VITALS — BP 100/68 | HR 87 | Temp 97.7°F | Ht 64.0 in | Wt 123.2 lb

## 2023-10-17 DIAGNOSIS — R300 Dysuria: Secondary | ICD-10-CM | POA: Diagnosis not present

## 2023-10-17 DIAGNOSIS — N3001 Acute cystitis with hematuria: Secondary | ICD-10-CM | POA: Diagnosis not present

## 2023-10-17 LAB — URINALYSIS, ROUTINE W REFLEX MICROSCOPIC
Hyaline Cast: NONE SEEN /LPF
Nitrite: POSITIVE — AB
Specific Gravity, Urine: 1.01 (ref 1.001–1.035)
pH: 5 (ref 5.0–8.0)

## 2023-10-17 LAB — MICROSCOPIC MESSAGE

## 2023-10-17 MED ORDER — SULFAMETHOXAZOLE-TRIMETHOPRIM 800-160 MG PO TABS
1.0000 | ORAL_TABLET | Freq: Two times a day (BID) | ORAL | 0 refills | Status: DC
  Filled 2023-10-17: qty 10, 5d supply, fill #0

## 2023-10-17 NOTE — Assessment & Plan Note (Signed)
 Urine dipstick shows positive for all components in setting of AZO use.  Micro exam: 0.5 WBC's per HPF, 0-3 RBC's per HPF, and moderate+ bacteria. Start Bactrim DS BID x5d. Push fluids. Return to office if symptoms persist or worsen.

## 2023-10-17 NOTE — Progress Notes (Signed)
 Subjective:  HPI: Kayla Rice is a 23 y.o. female presenting on 10/17/2023 for Urinary Tract Infection (Started last week w/ pelvic area discomfort, pain when sitting and standing. Pain in abdomen and pelvic area, no reports of discharge, or odor)   Urinary Tract Infection    Patient is in today for pelvic and lower abdominal pain and discomfort since last week. Originally presumed menstrual. Symptoms then began to include dysuria. Denies fever, chills, body aches, flank pain, vaginal discharge or abnormal uterine bleeding, hematuria, urinary frequency. Has tried AZO  Review of Systems  All other systems reviewed and are negative.   Relevant past medical history reviewed and updated as indicated.   Past Medical History:  Diagnosis Date   Asthma    as a child when sick   Seasonal allergies      Past Surgical History:  Procedure Laterality Date   TYMPANOSTOMY TUBE PLACEMENT      Allergies and medications reviewed and updated.   Current Outpatient Medications:    cetirizine  (ZYRTEC ) 10 MG tablet, Take 1 tablet (10 mg total) by mouth daily., Disp: 30 tablet, Rfl: 5   lisdexamfetamine (VYVANSE ) 20 MG capsule, Take 1 capsule (20 mg total) by mouth every morning., Disp: 30 capsule, Rfl: 0   medroxyPROGESTERone  (PROVERA ) 10 MG tablet, Take 1 tablet (10 mg total) by mouth daily., Disp: 10 tablet, Rfl: 2   sulfamethoxazole-trimethoprim (BACTRIM DS) 800-160 MG tablet, Take 1 tablet by mouth 2 (two) times daily., Disp: 10 tablet, Rfl: 0   tazarotene  (TAZORAC ) 0.05 % cream, Apply a pea-sized amount to face nightly as tolerated. Apply light moisturizer first., Disp: 30 g, Rfl: 3  No Known Allergies  Objective:   BP 100/68   Pulse 87   Temp 97.7 F (36.5 C)   Ht 5\' 4"  (1.626 m)   Wt 123 lb 3.2 oz (55.9 kg)   LMP 10/17/2023 (Exact Date)   SpO2 97%   BMI 21.15 kg/m      10/17/2023   10:19 AM 09/14/2023    3:59 PM 09/13/2023   10:54 AM  Vitals with BMI  Height 5\' 4"  5\' 4"     Weight 123 lbs 3 oz 121 lbs 4 oz 120 lbs 6 oz  BMI 21.14 20.8   Systolic 100 102 99  Diastolic 68 68 89  Pulse 87 100 89     Physical Exam Vitals and nursing note reviewed.  Constitutional:      Appearance: Normal appearance. She is normal weight.  HENT:     Head: Normocephalic and atraumatic.  Abdominal:     General: Abdomen is flat. There is no distension.     Palpations: Abdomen is soft.     Tenderness: There is no abdominal tenderness. There is no right CVA tenderness or left CVA tenderness.  Skin:    General: Skin is warm and dry.  Neurological:     General: No focal deficit present.     Mental Status: She is alert and oriented to person, place, and time. Mental status is at baseline.  Psychiatric:        Mood and Affect: Mood normal.        Behavior: Behavior normal.        Thought Content: Thought content normal.        Judgment: Judgment normal.     Assessment & Plan:  Acute cystitis with hematuria Assessment & Plan: Urine dipstick shows positive for all components in setting of AZO use.  Micro exam: 0.5  WBC's per HPF, 0-3 RBC's per HPF, and moderate+ bacteria. Start Bactrim DS BID x5d. Push fluids. Return to office if symptoms persist or worsen.     Dysuria -     Urinalysis, Routine w reflex microscopic  Other orders -     Sulfamethoxazole-Trimethoprim; Take 1 tablet by mouth 2 (two) times daily.  Dispense: 10 tablet; Refill: 0     Follow up plan: Return if symptoms worsen or fail to improve.  Jenelle Mis, FNP

## 2023-10-18 DIAGNOSIS — F4322 Adjustment disorder with anxiety: Secondary | ICD-10-CM | POA: Diagnosis not present

## 2023-10-19 ENCOUNTER — Encounter: Payer: Self-pay | Admitting: Family Medicine

## 2023-10-19 ENCOUNTER — Other Ambulatory Visit: Payer: Self-pay | Admitting: Family Medicine

## 2023-10-19 ENCOUNTER — Ambulatory Visit: Payer: Self-pay

## 2023-10-19 ENCOUNTER — Other Ambulatory Visit (HOSPITAL_BASED_OUTPATIENT_CLINIC_OR_DEPARTMENT_OTHER): Payer: Self-pay

## 2023-10-19 MED ORDER — CEPHALEXIN 500 MG PO CAPS
500.0000 mg | ORAL_CAPSULE | Freq: Two times a day (BID) | ORAL | 0 refills | Status: AC
Start: 2023-10-19 — End: ?
  Filled 2023-10-19: qty 14, 7d supply, fill #0

## 2023-10-19 NOTE — Telephone Encounter (Signed)
 FYI Only or Action Required?: Action required by provider  Patient was last seen in primary care on 10/17/2023 by Jenelle Mis, FNP. Called Nurse Triage reporting No chief complaint on file.. Symptoms began several days ago. Interventions attempted: Nothing. Symptoms are: gradually worsening.  Triage Disposition: See Physician Within 24 Hours- Warmed transferred to [facility] nurses/NP   Patient/caregiver understands and will follow disposition?: -Yes         Reason for Disposition  MODERATE diarrhea (e.g., 4-6 times / day more than normal)  Answer Assessment - Initial Assessment Questions 1. ANTIBIOTIC: "What antibiotic are you taking?" "How many times per day?"     Bactrim   2. ANTIBIOTIC ONSET: "When was the antibiotic started?"     ----10/17/23   3. DIARRHEA SEVERITY: "How bad is the diarrhea?" "How many more stools have you had in the past 24 hours than normal?"    - NO DIARRHEA (SCALE 0)   - MILD (SCALE 1-3): Few loose or mushy BMs; increase of 1-3 stools over normal daily number of stools; mild increase in ostomy output.   -  MODERATE (SCALE 4-7): Increase of 4-6 stools daily over normal; moderate increase in ostomy output. * SEVERE (SCALE 8-10; OR 'WORST POSSIBLE'): Increase of 7 or more stools daily over normal; moderate increase in ostomy output; incontinence.     ----Moderate...   4. ONSET: "When did the diarrhea begin?"      ---- Monday 10/17/23  5. BM CONSISTENCY: "How loose or watery is the diarrhea?"  ----Watery       6. VOMITING: "Are you also vomiting?" If Yes, ask: "How many times in the past 24 hours?"      ------------ Denies  7. ABDOMEN PAIN: "Are you having any abdomen pain?" If Yes, ask: "What does it feel like?" (e.g., crampy, dull, intermittent, constant)      ----------Denies    8. ABDOMEN PAIN SEVERITY: If present, ask: "How bad is the pain?"  (e.g., Scale 1-10; mild, moderate, or severe)   - MILD (1-3): doesn't interfere with normal activities,  abdomen soft and not tender to touch    - MODERATE (4-7): interferes with normal activities or awakens from sleep, abdomen tender to touch    - SEVERE (8-10): excruciating pain, doubled over, unable to do any normal activities       -------------   Additional Info:  Was prescribed Bactrim recently from PCP, for UTI.  S/s started very soon after.  Protocols used: Diarrhea on Antibiotics-A-AH

## 2023-11-04 DIAGNOSIS — F4322 Adjustment disorder with anxiety: Secondary | ICD-10-CM | POA: Diagnosis not present

## 2023-11-08 DIAGNOSIS — F902 Attention-deficit hyperactivity disorder, combined type: Secondary | ICD-10-CM | POA: Diagnosis not present

## 2023-11-08 DIAGNOSIS — Z79899 Other long term (current) drug therapy: Secondary | ICD-10-CM | POA: Diagnosis not present

## 2023-11-09 ENCOUNTER — Other Ambulatory Visit (HOSPITAL_BASED_OUTPATIENT_CLINIC_OR_DEPARTMENT_OTHER): Payer: Self-pay

## 2023-11-09 DIAGNOSIS — F902 Attention-deficit hyperactivity disorder, combined type: Secondary | ICD-10-CM | POA: Diagnosis not present

## 2023-11-09 DIAGNOSIS — Z79899 Other long term (current) drug therapy: Secondary | ICD-10-CM | POA: Diagnosis not present

## 2023-11-09 MED ORDER — LISDEXAMFETAMINE DIMESYLATE 20 MG PO CAPS
20.0000 mg | ORAL_CAPSULE | Freq: Every morning | ORAL | 0 refills | Status: DC
  Filled 2023-11-10: qty 30, 30d supply, fill #0

## 2023-11-10 ENCOUNTER — Other Ambulatory Visit (HOSPITAL_BASED_OUTPATIENT_CLINIC_OR_DEPARTMENT_OTHER): Payer: Self-pay

## 2023-11-16 DIAGNOSIS — F4322 Adjustment disorder with anxiety: Secondary | ICD-10-CM | POA: Diagnosis not present

## 2023-11-23 DIAGNOSIS — F4322 Adjustment disorder with anxiety: Secondary | ICD-10-CM | POA: Diagnosis not present

## 2023-11-30 DIAGNOSIS — F4322 Adjustment disorder with anxiety: Secondary | ICD-10-CM | POA: Diagnosis not present

## 2023-12-14 ENCOUNTER — Other Ambulatory Visit (HOSPITAL_BASED_OUTPATIENT_CLINIC_OR_DEPARTMENT_OTHER): Payer: Self-pay

## 2023-12-14 DIAGNOSIS — F4322 Adjustment disorder with anxiety: Secondary | ICD-10-CM | POA: Diagnosis not present

## 2023-12-14 MED ORDER — LISDEXAMFETAMINE DIMESYLATE 20 MG PO CAPS
20.0000 mg | ORAL_CAPSULE | Freq: Every morning | ORAL | 0 refills | Status: DC
  Filled 2023-12-14: qty 30, 30d supply, fill #0

## 2023-12-21 DIAGNOSIS — F4322 Adjustment disorder with anxiety: Secondary | ICD-10-CM | POA: Diagnosis not present

## 2024-01-20 ENCOUNTER — Other Ambulatory Visit (HOSPITAL_BASED_OUTPATIENT_CLINIC_OR_DEPARTMENT_OTHER): Payer: Self-pay

## 2024-01-20 MED ORDER — LISDEXAMFETAMINE DIMESYLATE 20 MG PO CAPS
20.0000 mg | ORAL_CAPSULE | Freq: Every morning | ORAL | 0 refills | Status: AC
  Filled 2024-01-20: qty 30, 30d supply, fill #0

## 2024-02-03 ENCOUNTER — Other Ambulatory Visit (HOSPITAL_BASED_OUTPATIENT_CLINIC_OR_DEPARTMENT_OTHER): Payer: Self-pay

## 2024-02-03 MED ORDER — FLUZONE 0.5 ML IM SUSY
0.5000 mL | PREFILLED_SYRINGE | Freq: Once | INTRAMUSCULAR | 0 refills | Status: AC
  Filled 2024-02-03: qty 0.5, 1d supply, fill #0

## 2024-02-13 DIAGNOSIS — Z79899 Other long term (current) drug therapy: Secondary | ICD-10-CM | POA: Diagnosis not present

## 2024-02-13 DIAGNOSIS — F902 Attention-deficit hyperactivity disorder, combined type: Secondary | ICD-10-CM | POA: Diagnosis not present

## 2024-02-17 ENCOUNTER — Other Ambulatory Visit (HOSPITAL_BASED_OUTPATIENT_CLINIC_OR_DEPARTMENT_OTHER): Payer: Self-pay

## 2024-02-17 MED ORDER — LISDEXAMFETAMINE DIMESYLATE 40 MG PO CAPS
40.0000 mg | ORAL_CAPSULE | Freq: Every morning | ORAL | 0 refills | Status: DC
  Filled 2024-02-17: qty 30, 30d supply, fill #0

## 2024-03-19 DIAGNOSIS — F4322 Adjustment disorder with anxiety: Secondary | ICD-10-CM | POA: Diagnosis not present

## 2024-03-19 DIAGNOSIS — F902 Attention-deficit hyperactivity disorder, combined type: Secondary | ICD-10-CM | POA: Diagnosis not present

## 2024-03-26 ENCOUNTER — Other Ambulatory Visit (HOSPITAL_BASED_OUTPATIENT_CLINIC_OR_DEPARTMENT_OTHER): Payer: Self-pay

## 2024-03-26 ENCOUNTER — Other Ambulatory Visit: Payer: Self-pay

## 2024-03-26 DIAGNOSIS — F4322 Adjustment disorder with anxiety: Secondary | ICD-10-CM | POA: Diagnosis not present

## 2024-03-26 DIAGNOSIS — F902 Attention-deficit hyperactivity disorder, combined type: Secondary | ICD-10-CM | POA: Diagnosis not present

## 2024-03-26 MED ORDER — LISDEXAMFETAMINE DIMESYLATE 40 MG PO CAPS
40.0000 mg | ORAL_CAPSULE | Freq: Every morning | ORAL | 0 refills | Status: AC
  Filled 2024-03-26: qty 30, 30d supply, fill #0

## 2024-04-04 ENCOUNTER — Other Ambulatory Visit (HOSPITAL_BASED_OUTPATIENT_CLINIC_OR_DEPARTMENT_OTHER): Payer: Self-pay

## 2024-04-04 DIAGNOSIS — F902 Attention-deficit hyperactivity disorder, combined type: Secondary | ICD-10-CM | POA: Diagnosis not present

## 2024-04-04 DIAGNOSIS — F4322 Adjustment disorder with anxiety: Secondary | ICD-10-CM | POA: Diagnosis not present

## 2024-04-04 MED ORDER — DEXMETHYLPHENIDATE HCL ER 20 MG PO CP24
20.0000 mg | ORAL_CAPSULE | Freq: Every morning | ORAL | 0 refills | Status: AC
  Filled 2024-04-04: qty 30, 30d supply, fill #0

## 2024-04-10 ENCOUNTER — Ambulatory Visit: Admitting: Dermatology

## 2024-04-23 DIAGNOSIS — F4322 Adjustment disorder with anxiety: Secondary | ICD-10-CM | POA: Diagnosis not present

## 2024-04-23 DIAGNOSIS — F902 Attention-deficit hyperactivity disorder, combined type: Secondary | ICD-10-CM | POA: Diagnosis not present

## 2024-04-30 DIAGNOSIS — F4322 Adjustment disorder with anxiety: Secondary | ICD-10-CM | POA: Diagnosis not present

## 2024-04-30 DIAGNOSIS — F902 Attention-deficit hyperactivity disorder, combined type: Secondary | ICD-10-CM | POA: Diagnosis not present

## 2024-05-02 ENCOUNTER — Other Ambulatory Visit: Payer: Self-pay | Admitting: Dermatology

## 2024-05-02 ENCOUNTER — Other Ambulatory Visit (HOSPITAL_BASED_OUTPATIENT_CLINIC_OR_DEPARTMENT_OTHER): Payer: Self-pay

## 2024-05-07 ENCOUNTER — Other Ambulatory Visit (HOSPITAL_BASED_OUTPATIENT_CLINIC_OR_DEPARTMENT_OTHER): Payer: Self-pay

## 2024-05-07 DIAGNOSIS — F4322 Adjustment disorder with anxiety: Secondary | ICD-10-CM | POA: Diagnosis not present

## 2024-05-07 DIAGNOSIS — F902 Attention-deficit hyperactivity disorder, combined type: Secondary | ICD-10-CM | POA: Diagnosis not present

## 2024-05-08 ENCOUNTER — Other Ambulatory Visit (HOSPITAL_BASED_OUTPATIENT_CLINIC_OR_DEPARTMENT_OTHER): Payer: Self-pay

## 2024-05-08 MED ORDER — TAZAROTENE 0.1 % EX CREA
TOPICAL_CREAM | CUTANEOUS | 11 refills | Status: AC
  Filled 2024-05-08: qty 30, 30d supply, fill #0

## 2024-05-08 NOTE — Telephone Encounter (Signed)
 New rx sent to pharmacy per Dr. Jackquline.

## 2024-05-14 ENCOUNTER — Other Ambulatory Visit (HOSPITAL_BASED_OUTPATIENT_CLINIC_OR_DEPARTMENT_OTHER): Payer: Self-pay

## 2024-05-14 DIAGNOSIS — Z79899 Other long term (current) drug therapy: Secondary | ICD-10-CM | POA: Diagnosis not present

## 2024-05-14 DIAGNOSIS — F902 Attention-deficit hyperactivity disorder, combined type: Secondary | ICD-10-CM | POA: Diagnosis not present

## 2024-05-14 MED ORDER — DEXMETHYLPHENIDATE HCL ER 30 MG PO CP24
1.0000 | ORAL_CAPSULE | Freq: Every morning | ORAL | 0 refills | Status: AC
  Filled 2024-05-14: qty 30, 30d supply, fill #0

## 2024-06-02 ENCOUNTER — Other Ambulatory Visit (HOSPITAL_BASED_OUTPATIENT_CLINIC_OR_DEPARTMENT_OTHER): Payer: Self-pay

## 2024-06-08 ENCOUNTER — Other Ambulatory Visit: Payer: Self-pay

## 2024-06-08 ENCOUNTER — Other Ambulatory Visit (HOSPITAL_BASED_OUTPATIENT_CLINIC_OR_DEPARTMENT_OTHER): Payer: Self-pay

## 2024-06-08 MED ORDER — AMPHETAMINE-DEXTROAMPHETAMINE 20 MG PO TABS
20.0000 mg | ORAL_TABLET | Freq: Two times a day (BID) | ORAL | 0 refills | Status: AC
  Filled 2024-06-08: qty 60, 30d supply, fill #0
